# Patient Record
Sex: Male | Born: 1985 | Race: Black or African American | Hispanic: No | Marital: Single | Smoking: Current every day smoker
Health system: Southern US, Community
[De-identification: ages and names within clinical notes are randomized; demographics above are authoritative.]

## PROBLEM LIST (undated history)

## (undated) ENCOUNTER — Emergency Department (HOSPITAL_COMMUNITY): Admission: EM | Payer: No Typology Code available for payment source | Source: Home / Self Care

## (undated) VITALS — BP 144/84 | HR 60 | Temp 98.3°F | Resp 16 | Ht 71.0 in | Wt 129.0 lb

## (undated) DIAGNOSIS — Z905 Acquired absence of kidney: Secondary | ICD-10-CM

---

## 2007-04-10 ENCOUNTER — Emergency Department (HOSPITAL_COMMUNITY): Admission: EM | Admit: 2007-04-10 | Discharge: 2007-04-10 | Payer: Self-pay | Admitting: Family Medicine

## 2009-12-04 ENCOUNTER — Emergency Department (HOSPITAL_COMMUNITY): Admission: EM | Admit: 2009-12-04 | Discharge: 2009-12-04 | Payer: Self-pay | Admitting: Emergency Medicine

## 2012-08-19 ENCOUNTER — Emergency Department (INDEPENDENT_AMBULATORY_CARE_PROVIDER_SITE_OTHER)
Admission: EM | Admit: 2012-08-19 | Discharge: 2012-08-19 | Disposition: A | Discharge status: Home / Self Care | Payer: Self-pay | Source: Home / Self Care | Attending: Family Medicine | Admitting: Family Medicine

## 2012-08-19 ENCOUNTER — Encounter (HOSPITAL_COMMUNITY): Payer: Self-pay | Admitting: Emergency Medicine

## 2012-08-19 DIAGNOSIS — S39012A Strain of muscle, fascia and tendon of lower back, initial encounter: Secondary | ICD-10-CM

## 2012-08-19 DIAGNOSIS — J069 Acute upper respiratory infection, unspecified: Secondary | ICD-10-CM

## 2012-08-19 DIAGNOSIS — S335XXA Sprain of ligaments of lumbar spine, initial encounter: Secondary | ICD-10-CM

## 2012-08-19 MED ORDER — NAPROXEN 500 MG PO TABS: 500.0000 mg | ORAL_TABLET | Freq: Two times a day (BID) | ORAL | Status: DC

## 2012-08-19 MED ORDER — CYCLOBENZAPRINE HCL 10 MG PO TABS: 10.0000 mg | ORAL_TABLET | Freq: Three times a day (TID) | ORAL | Status: DC | PRN

## 2012-08-19 MED ORDER — CETIRIZINE-PSEUDOEPHEDRINE ER 5-120 MG PO TB12: 1.0000 | ORAL_TABLET | Freq: Two times a day (BID) | ORAL | Status: DC

## 2012-08-19 NOTE — ED Provider Notes (Signed)
History     CSN: [id number]  Arrival date & time 08/19/12  1208   First MD Initiated Contact with Patient 08/19/12 1312      Chief Complaint  Patient presents with  . URI    (Consider location/radiation/quality/duration/timing/severity/associated sxs/prior treatment) Patient is a 26 y.o. male presenting with URI and musculoskeletal pain. The history is provided by the patient.  URI The primary symptoms include cough. Primary symptoms do not include headaches or abdominal pain.  The cough began 3 to 5 days ago. The cough is non-productive.  The onset of the illness is associated with exposure to sick contacts. Symptoms associated with the illness include chills, sinus pressure, congestion and rhinorrhea. The following treatments were addressed: Acetaminophen was ineffective. A decongestant was effective (sudafed).  Muscle Pain This is a new problem. The current episode started more than 2 days ago. The problem occurs daily. The problem has not changed since onset.Pertinent negatives include no chest pain, no abdominal pain, no headaches and no shortness of breath. The symptoms are aggravated by bending. The symptoms are relieved by rest. He has tried nothing for the symptoms.    History reviewed. No pertinent past medical history.  History reviewed. No pertinent past surgical history.  History reviewed. No pertinent family history.  History  Substance Use Topics  . Smoking status: Never Smoker   . Smokeless tobacco: Not on file  . Alcohol Use: No      Review of Systems  Constitutional: Positive for chills.  HENT: Positive for congestion, rhinorrhea and sinus pressure.   Respiratory: Positive for cough. Negative for shortness of breath.   Cardiovascular: Negative for chest pain.  Gastrointestinal: Positive for diarrhea. Negative for abdominal pain.  Musculoskeletal: Positive for back pain.  Neurological: Negative for headaches.  All other systems reviewed and are  negative.    Allergies  Review of patient's allergies indicates no known allergies.  Home Medications   Current Outpatient Rx  Name  Route  Sig  Dispense  Refill  . CETIRIZINE-PSEUDOEPHEDRINE ER 5-120 MG PO TB12   Oral   Take 1 tablet by mouth 2 (two) times daily.   30 tablet   1   . CYCLOBENZAPRINE HCL 10 MG PO TABS   Oral   Take 1 tablet (10 mg total) by mouth 3 (three) times daily as needed for muscle spasms.   30 tablet   0   . NAPROXEN 500 MG PO TABS   Oral   Take 1 tablet (500 mg total) by mouth 2 (two) times daily.   60 tablet   0     BP 132/73  Pulse 79  Temp 99.9 F (37.7 C) (Oral)  Resp 19  SpO2 100%  Physical Exam  Nursing note and vitals reviewed. Constitutional: He is oriented to person, place, and time. Vital signs are normal. He appears well-developed and well-nourished. He is active and cooperative.  HENT:  Head: Normocephalic.  Right Ear: External ear normal.  Left Ear: External ear normal.  Nose: Nose normal.  Mouth/Throat: Oropharynx is clear and moist. No oropharyngeal exudate.  Eyes: Conjunctivae normal and EOM are normal. Pupils are equal, round, and reactive to light. No scleral icterus.  Neck: Trachea normal and normal range of motion. Neck supple.  Cardiovascular: Normal rate, regular rhythm, normal heart sounds and intact distal pulses.   No murmur heard. Pulmonary/Chest: Effort normal and breath sounds normal.  Musculoskeletal:       Cervical back: Normal.  Thoracic back: Normal.       Lumbar back: Normal.  Lymphadenopathy:       Head (right side): No submental, no submandibular, no tonsillar, no preauricular, no posterior auricular and no occipital adenopathy present.       Head (left side): No submental, no submandibular, no tonsillar, no preauricular, no posterior auricular and no occipital adenopathy present.    He has no cervical adenopathy.  Neurological: He is alert and oriented to person, place, and time. No cranial  nerve deficit or sensory deficit.  Skin: Skin is warm and dry. No rash noted.  Psychiatric: He has a normal mood and affect. His speech is normal and behavior is normal. Judgment and thought content normal. Cognition and memory are normal.    ED Course  Procedures (including critical care time)  Labs Reviewed - No data to display No results found.   1. URI (upper respiratory infection)   2. Lumbar strain       MDM  Increase fluid intake, rest.  Begin expectorant/decongestant, topical decongestant, saline nasal spray and/or saline irrigation, and cough suppressant at bedtime. Antihistamines of your choice (Claritin or Zyrtec).  Tylenol or Motrin for fever/discomfort.  Followup with PCP if not improving 7 to 10 days.  Heat therapy, nsaids and relaxants for back pain.      Johnsie Kindred, NP 08/19/12 1320

## 2012-08-19 NOTE — ED Notes (Signed)
Reports cough, chills, and diarrhea since Sunday.  Denies vomiting and taking any medication.

## 2012-08-21 NOTE — ED Provider Notes (Signed)
Medical screening examination/treatment/procedure(s) were performed by non-physician practitioner and as supervising physician I was immediately available for consultation/collaboration.   [facility]; MD   Sharin Grave, MD 08/21/12 [phone]

## 2015-03-16 ENCOUNTER — Encounter (HOSPITAL_COMMUNITY): Payer: Self-pay | Admitting: Emergency Medicine

## 2015-03-16 ENCOUNTER — Emergency Department (HOSPITAL_COMMUNITY)
Admission: EM | Admit: 2015-03-16 | Discharge: 2015-03-16 | Disposition: A | Discharge status: Home / Self Care | Payer: Self-pay | Attending: Emergency Medicine | Admitting: Emergency Medicine

## 2015-03-16 DIAGNOSIS — Z79899 Other long term (current) drug therapy: Secondary | ICD-10-CM | POA: Insufficient documentation

## 2015-03-16 DIAGNOSIS — K0889 Other specified disorders of teeth and supporting structures: Secondary | ICD-10-CM

## 2015-03-16 DIAGNOSIS — Z791 Long term (current) use of non-steroidal anti-inflammatories (NSAID): Secondary | ICD-10-CM | POA: Insufficient documentation

## 2015-03-16 DIAGNOSIS — R519 Headache, unspecified: Secondary | ICD-10-CM

## 2015-03-16 DIAGNOSIS — D170 Benign lipomatous neoplasm of skin and subcutaneous tissue of head, face and neck: Secondary | ICD-10-CM | POA: Insufficient documentation

## 2015-03-16 DIAGNOSIS — K088 Other specified disorders of teeth and supporting structures: Secondary | ICD-10-CM | POA: Insufficient documentation

## 2015-03-16 DIAGNOSIS — D179 Benign lipomatous neoplasm, unspecified: Secondary | ICD-10-CM

## 2015-03-16 DIAGNOSIS — R51 Headache: Secondary | ICD-10-CM | POA: Insufficient documentation

## 2015-03-16 DIAGNOSIS — Z720 Tobacco use: Secondary | ICD-10-CM | POA: Insufficient documentation

## 2015-03-16 MED ORDER — PENICILLIN V POTASSIUM 250 MG PO TABS: 250.0000 mg | ORAL_TABLET | Freq: Four times a day (QID) | ORAL | Status: AC

## 2015-03-16 MED ORDER — CYCLOBENZAPRINE HCL 10 MG PO TABS: 10.0000 mg | ORAL_TABLET | Freq: Two times a day (BID) | ORAL | Status: DC | PRN

## 2015-03-16 NOTE — ED Provider Notes (Signed)
CSN: [id number]     Arrival date & time 03/16/15  0945 History   First MD Initiated Contact with Patient 03/16/15 [phone]     Chief Complaint  Patient presents with  . knot of head   . Neck Pain     (Consider location/radiation/quality/duration/timing/severity/associated sxs/prior Treatment) HPI Comments: Patient presents to the ED with a chief complaint of headache, knot on forehead, and dental pain.  Patient states that he has had a knot on his head for the past 9 months.  The knot is non-tender to palpation.  Denies any discharge or drainage.  Denies any fevers or chills.  Headache: intermittent headaches with associated upper back tightness/tenderness.  Has not tried taking anything to alleviate his symptoms.  Symptoms are improved with massage.    The history is provided by the patient. No language interpreter was used.    History reviewed. No pertinent past medical history. History reviewed. No pertinent past surgical history. Family History  Problem Relation Age of Onset  . Alcoholism Mother   . Cirrhosis Mother   . Cancer Sister   . Cirrhosis Brother   . Lupus Other    History  Substance Use Topics  . Smoking status: Current Every Day Smoker  . Smokeless tobacco: Not on file  . Alcohol Use: No     Comment: socially    Review of Systems  Constitutional: Negative for fever and chills.  HENT: Positive for dental problem.   Respiratory: Negative for shortness of breath.   Cardiovascular: Negative for chest pain.  Gastrointestinal: Negative for nausea, vomiting, diarrhea and constipation.  Genitourinary: Negative for dysuria.  Musculoskeletal: Positive for myalgias and back pain.  Neurological: Positive for headaches.  All other systems reviewed and are negative.     Allergies  Review of patient's allergies indicates no known allergies.  Home Medications   Prior to Admission medications   Medication Sig Start Date End Date Taking? Authorizing Provider   cetirizine-pseudoephedrine (ZYRTEC-D) 5-120 MG per tablet Take 1 tablet by mouth 2 (two) times daily. 08/19/12   Awilda Metro, NP  cyclobenzaprine (FLEXERIL) 10 MG tablet Take 1 tablet (10 mg total) by mouth 3 (three) times daily as needed for muscle spasms. 08/19/12   Awilda Metro, NP  naproxen (NAPROSYN) 500 MG tablet Take 1 tablet (500 mg total) by mouth 2 (two) times daily. 08/19/12   Carmen L Chatten, NP   BP 141/76 mmHg  Pulse 61  Temp(Src) 97.6 F (36.4 C) (Oral)  Resp 18  SpO2 100% Physical Exam  Constitutional: He is oriented to person, place, and time. He appears well-developed and well-nourished.  HENT:  Head: Normocephalic and atraumatic.  Right Ear: External ear normal.  Left Ear: External ear normal.  2x2 cm lipoma in center of forehead  Eyes: Conjunctivae and EOM are normal. Pupils are equal, round, and reactive to light. Right eye exhibits no discharge. Left eye exhibits no discharge. No scleral icterus.  Neck: Normal range of motion. Neck supple. No JVD present.  No pain with neck flexion, no meningismus  Cardiovascular: Normal rate, regular rhythm and normal heart sounds.  Exam reveals no gallop and no friction rub.   No murmur heard. Pulmonary/Chest: Effort normal and breath sounds normal. No respiratory distress. He has no wheezes. He has no rales. He exhibits no tenderness.  Abdominal: Soft. He exhibits no distension and no mass. There is no tenderness. There is no rebound and no guarding.  Musculoskeletal: Normal range of motion. He exhibits no  edema or tenderness.  Normal gait.  Neurological: He is alert and oriented to person, place, and time. He has normal reflexes.  CN 3-12 intact, speech is clear, movements goal oriented, sensation and strength intact bilaterally.  Skin: Skin is warm and dry.  Psychiatric: He has a normal mood and affect. His behavior is normal. Judgment and thought content normal.  Nursing note and vitals reviewed.   ED Course   Procedures (including critical care time) Labs Review Labs Reviewed - No data to display  Imaging Review No results found.   EKG Interpretation None      MDM   Final diagnoses:  Headache, unspecified headache type  Lipoma  Pain, dental    Patient with lipoma of forehead, tension headache, and dental pain.  DC with conservative therapy and plastic surgery/dental follow-up.  Seen by and discussed with Dr. Tawnya Crook, who agrees with the plan.    Montine Circle, PA-C 03/16/15 1038  Ernestina Patches, MD 03/17/15 [phone]

## 2015-03-16 NOTE — ED Notes (Signed)
Pt has knot-- in center of forehead that has been there for 9 months. States is causing headaches -- has had increased stress level due to sister passed in March of Leukemia and brother passed in October-- of cirrhosis. Raising sister's children and own children. States has stiff neck , pain in neck also

## 2015-03-16 NOTE — Discharge Instructions (Signed)
Dental Pain A tooth ache may be caused by cavities (tooth decay). Cavities expose the nerve of the tooth to air and hot or cold temperatures. It may come from an infection or abscess (also called a boil or furuncle) around your tooth. It is also often caused by dental caries (tooth decay). This causes the pain you are having. DIAGNOSIS  Your caregiver can diagnose this problem by exam. TREATMENT   If caused by an infection, it may be treated with medications which kill germs (antibiotics) and pain medications as prescribed by your caregiver. Take medications as directed.  Only take over-the-counter or prescription medicines for pain, discomfort, or fever as directed by your caregiver.  Whether the tooth ache today is caused by infection or dental disease, you should see your dentist as soon as possible for further care. SEEK MEDICAL CARE IF: The exam and treatment you received today has been provided on an emergency basis only. This is not a substitute for complete medical or dental care. If your problem worsens or new problems (symptoms) appear, and you are unable to meet with your dentist, call or return to this location. SEEK IMMEDIATE MEDICAL CARE IF:   You have a fever.  You develop redness and swelling of your face, jaw, or neck.  You are unable to open your mouth.  You have severe pain uncontrolled by pain medicine. MAKE SURE YOU:   Understand these instructions.  Will watch your condition.  Will get help right away if you are not doing well or get worse. Document Released: 09/02/2005 Document Revised: 11/25/2011 Document Reviewed: 04/20/2008 [facility] Patient Information 2015 [place], [place]. This information is not intended to replace advice given to you by your health care provider. Make sure you discuss any questions you have with your health care provider. Lipoma A lipoma is a noncancerous (benign) tumor composed of fat cells. They are usually found under the skin  (subcutaneous). A lipoma may occur in any tissue of the body that contains fat. Common areas for lipomas to appear include the back, shoulders, buttocks, and thighs. Lipomas are a very common soft tissue growth. They are soft and grow slowly. Most problems caused by a lipoma depend on where it is growing. DIAGNOSIS  A lipoma can be diagnosed with a physical exam. These tumors rarely become cancerous, but radiographic studies can help determine this for certain. Studies used may include:  Computerized X-ray scans (CT or CAT scan).  Computerized magnetic scans (MRI). TREATMENT  Small lipomas that are not causing problems may be watched. If a lipoma continues to enlarge or causes problems, removal is often the best treatment. Lipomas can also be removed to improve appearance. Surgery is done to remove the fatty cells and the surrounding capsule. Most often, this is done with medicine that numbs the area (local anesthetic). The removed tissue is examined under a microscope to make sure it is not cancerous. Keep all follow-up appointments with your caregiver. SEEK MEDICAL CARE IF:   The lipoma becomes larger or hard.  The lipoma becomes painful, red, or increasingly swollen. These could be signs of infection or a more serious condition. Document Released: 08/23/2002 Document Revised: 11/25/2011 Document Reviewed: 02/02/2010 [facility] Patient Information 2015 [place], [place]. This information is not intended to replace advice given to you by your health care provider. Make sure you discuss any questions you have with your health care provider. Tension Headache A tension headache is a feeling of pain, pressure, or aching often felt over the front and  sides of the head. The pain can be dull or can feel tight (constricting). It is the most common type of headache. Tension headaches are not normally associated with nausea or vomiting and do not get worse with physical activity. Tension headaches can last 30  minutes to several days.  CAUSES  The exact cause is not known, but it may be caused by chemicals and hormones in the brain that lead to pain. Tension headaches often begin after stress, anxiety, or depression. Other triggers may include:  Alcohol.  Caffeine (too much or withdrawal).  Respiratory infections (colds, flu, sinus infections).  Dental problems or teeth clenching.  Fatigue.  Holding your head and neck in one position too long while using a computer. SYMPTOMS   Pressure around the head.   Dull, aching head pain.   Pain felt over the front and sides of the head.   Tenderness in the muscles of the head, neck, and shoulders. DIAGNOSIS  A tension headache is often diagnosed based on:   Symptoms.   Physical examination.   A CT scan or MRI of your head. These tests may be ordered if symptoms are severe or unusual. TREATMENT  Medicines may be given to help relieve symptoms.  HOME CARE INSTRUCTIONS   Only take over-the-counter or prescription medicines for pain or discomfort as directed by your caregiver.   Lie down in a dark, quiet room when you have a headache.   Keep a journal to find out what may be triggering your headaches. For example, write down:  What you eat and drink.  How much sleep you get.  Any change to your diet or medicines.  Try massage or other relaxation techniques.   Ice packs or heat applied to the head and neck can be used. Use these 3 to 4 times per day for 15 to 20 minutes each time, or as needed.   Limit stress.   Sit up straight, and do not tense your muscles.   Quit smoking if you smoke.  Limit alcohol use.  Decrease the amount of caffeine you drink, or stop drinking caffeine.  Eat and exercise regularly.  Get 7 to 9 hours of sleep, or as recommended by your caregiver.  Avoid excessive use of pain medicine as recurrent headaches can occur.  SEEK MEDICAL CARE IF:   You have problems with the medicines you  were prescribed.  Your medicines do not work.  You have a change from the usual headache.  You have nausea or vomiting. SEEK IMMEDIATE MEDICAL CARE IF:   Your headache becomes severe.  You have a fever.  You have a stiff neck.  You have loss of vision.  You have muscular weakness or loss of muscle control.  You lose your balance or have trouble walking.  You feel faint or pass out.  You have severe symptoms that are different from your first symptoms. MAKE SURE YOU:   Understand these instructions.  Will watch your condition.  Will get help right away if you are not doing well or get worse. Document Released: 09/02/2005 Document Revised: 11/25/2011 Document Reviewed: 08/23/2011 [facility] Patient Information 2015 [place], [place]. This information is not intended to replace advice given to you by your health care provider. Make sure you discuss any questions you have with your health care provider.

## 2016-03-02 ENCOUNTER — Encounter (HOSPITAL_COMMUNITY): Payer: Self-pay

## 2016-03-02 ENCOUNTER — Emergency Department (HOSPITAL_COMMUNITY): Payer: Self-pay

## 2016-03-02 ENCOUNTER — Inpatient Hospital Stay (HOSPITAL_COMMUNITY)
Admission: EM | Admit: 2016-03-02 | Discharge: 2016-03-05 | DRG: 201 | Disposition: A | Discharge status: Home / Self Care | Payer: Self-pay | Attending: Surgery | Admitting: Surgery

## 2016-03-02 DIAGNOSIS — J939 Pneumothorax, unspecified: Secondary | ICD-10-CM | POA: Diagnosis present

## 2016-03-02 DIAGNOSIS — Z4682 Encounter for fitting and adjustment of non-vascular catheter: Secondary | ICD-10-CM

## 2016-03-02 DIAGNOSIS — J9311 Primary spontaneous pneumothorax: Secondary | ICD-10-CM

## 2016-03-02 DIAGNOSIS — F17210 Nicotine dependence, cigarettes, uncomplicated: Secondary | ICD-10-CM | POA: Diagnosis present

## 2016-03-02 DIAGNOSIS — J9383 Other pneumothorax: Principal | ICD-10-CM | POA: Diagnosis present

## 2016-03-02 LAB — CBC
HCT: 42.2 % (ref 39.0–52.0)
HEMOGLOBIN: 14.4 g/dL (ref 13.0–17.0)
MCH: 31.4 pg (ref 26.0–34.0)
MCHC: 34.1 g/dL (ref 30.0–36.0)
MCV: 92.1 fL (ref 78.0–100.0)
Platelets: 215 10*3/uL (ref 150–400)
RBC: 4.58 MIL/uL (ref 4.22–5.81)
RDW: 12.3 % (ref 11.5–15.5)
WBC: 6.4 10*3/uL (ref 4.0–10.5)

## 2016-03-02 LAB — BASIC METABOLIC PANEL
ANION GAP: 8 (ref 5–15)
BUN: 10 mg/dL (ref 6–20)
CHLORIDE: 103 mmol/L (ref 101–111)
CO2: 25 mmol/L (ref 22–32)
Calcium: 10 mg/dL (ref 8.9–10.3)
Creatinine, Ser: 1.11 mg/dL (ref 0.61–1.24)
Glucose, Bld: 107 mg/dL — ABNORMAL HIGH (ref 65–99)
POTASSIUM: 3.9 mmol/L (ref 3.5–5.1)
SODIUM: 136 mmol/L (ref 135–145)

## 2016-03-02 LAB — I-STAT TROPONIN, ED: TROPONIN I, POC: 0 ng/mL (ref 0.00–0.08)

## 2016-03-02 MED ORDER — ENOXAPARIN SODIUM 40 MG/0.4ML ~~LOC~~ SOLN
40.0000 mg | SUBCUTANEOUS | Status: DC
  Administered 2016-03-02 – 2016-03-04 (×3): 40 mg via SUBCUTANEOUS
  Filled 2016-03-02 (×3): qty 0.4

## 2016-03-02 MED ORDER — MORPHINE SULFATE (PF) 4 MG/ML IV SOLN
4.0000 mg | Freq: Once | INTRAVENOUS | Status: AC
  Administered 2016-03-02: 4 mg via INTRAVENOUS
  Filled 2016-03-02: qty 1

## 2016-03-02 MED ORDER — SODIUM CHLORIDE 0.9% FLUSH: 3.0000 mL | INTRAVENOUS | Status: DC | PRN

## 2016-03-02 MED ORDER — ACETAMINOPHEN 325 MG PO TABS
650.0000 mg | ORAL_TABLET | Freq: Four times a day (QID) | ORAL | Status: DC | PRN
  Administered 2016-03-04: 650 mg via ORAL
  Filled 2016-03-02 (×2): qty 2

## 2016-03-02 MED ORDER — SODIUM CHLORIDE 0.9% FLUSH
3.0000 mL | Freq: Two times a day (BID) | INTRAVENOUS | Status: DC
Start: 2016-03-02 — End: 2016-03-05
  Administered 2016-03-03 (×2): 3 mL via INTRAVENOUS

## 2016-03-02 MED ORDER — SENNA 8.6 MG PO TABS
1.0000 | ORAL_TABLET | Freq: Two times a day (BID) | ORAL | Status: DC
  Administered 2016-03-03 – 2016-03-04 (×2): 8.6 mg via ORAL
  Filled 2016-03-02 (×4): qty 1

## 2016-03-02 MED ORDER — SODIUM CHLORIDE 0.9% FLUSH
3.0000 mL | Freq: Two times a day (BID) | INTRAVENOUS | Status: DC
Start: 2016-03-02 — End: 2016-03-05
  Administered 2016-03-02 – 2016-03-05 (×5): 3 mL via INTRAVENOUS

## 2016-03-02 MED ORDER — MIDAZOLAM HCL 2 MG/2ML IJ SOLN
2.0000 mg | Freq: Once | INTRAMUSCULAR | Status: AC
  Administered 2016-03-02: 2 mg via INTRAVENOUS

## 2016-03-02 MED ORDER — MORPHINE SULFATE (PF) 4 MG/ML IV SOLN
4.0000 mg | INTRAVENOUS | Status: DC | PRN
  Administered 2016-03-02: 4 mg via INTRAVENOUS
  Filled 2016-03-02: qty 1

## 2016-03-02 MED ORDER — SODIUM CHLORIDE 0.9 % IV SOLN: 250.0000 mL | INTRAVENOUS | Status: DC | PRN

## 2016-03-02 MED ORDER — OXYCODONE HCL 5 MG PO TABS
5.0000 mg | ORAL_TABLET | ORAL | Status: DC | PRN
  Administered 2016-03-02: 10 mg via ORAL
  Administered 2016-03-03: 5 mg via ORAL
  Administered 2016-03-03 – 2016-03-05 (×8): 10 mg via ORAL
  Filled 2016-03-02 (×11): qty 2

## 2016-03-02 MED ORDER — ONDANSETRON HCL 4 MG/2ML IJ SOLN
4.0000 mg | Freq: Once | INTRAMUSCULAR | Status: AC
  Administered 2016-03-02: 4 mg via INTRAVENOUS
  Filled 2016-03-02: qty 2

## 2016-03-02 MED ORDER — MIDAZOLAM HCL 2 MG/2ML IJ SOLN
INTRAMUSCULAR | Status: AC
  Filled 2016-03-02: qty 2

## 2016-03-02 MED ORDER — OXYCODONE HCL 5 MG PO TABS
5.0000 mg | ORAL_TABLET | ORAL | Status: DC | PRN
  Administered 2016-03-02: 5 mg via ORAL
  Filled 2016-03-02: qty 1

## 2016-03-02 MED ORDER — OXYCODONE HCL 5 MG PO TABS
10.0000 mg | ORAL_TABLET | ORAL | Status: DC | PRN
  Filled 2016-03-02: qty 2

## 2016-03-02 NOTE — ED Notes (Addendum)
Patient transported to X-ray 
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
## 2016-03-02 NOTE — ED Notes (Signed)
X-ray at bedside

## 2016-03-02 NOTE — CV Procedure (Signed)
Chest Tube Insertion Procedure Note  Indications:  Clinically significant left spontaneousPneumothorax  Pre-operative Diagnosis: Pneumothorax  Post-operative Diagnosis: Pneumothorax  Procedure Details  Informed consent was obtained for the procedure, including sedation.  Risks of lung perforation, hemorrhage, arrhythmia, and adverse drug reaction were discussed.   After sterile skin prep, using standard technique, a 20 French tube was placed in the left lateral 6th rib space.  Findings: None  Estimated Blood Loss:  Minimal         Specimens:  None              Complications:  None; patient tolerated the procedure well.         Disposition: admit to 2W         Condition: stable  Attending Attestation: I performed the procedure.

## 2016-03-02 NOTE — ED Notes (Signed)
Patient complains of 3 days of chest pain that he describes as sharp with shortness of breath with exertion. Denies trauma

## 2016-03-02 NOTE — ED Notes (Signed)
Dr. Cyndia Bent at bedside preparing for chest tube insertion
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
## 2016-03-03 ENCOUNTER — Inpatient Hospital Stay (HOSPITAL_COMMUNITY): Payer: Self-pay

## 2016-03-03 MED ORDER — IBUPROFEN 800 MG PO TABS
800.0000 mg | ORAL_TABLET | Freq: Three times a day (TID) | ORAL | Status: DC | PRN
  Administered 2016-03-03 – 2016-03-05 (×5): 800 mg via ORAL
  Filled 2016-03-03 (×5): qty 1

## 2016-03-03 NOTE — Progress Notes (Signed)
Utilization review completed.  

## 2016-03-03 NOTE — Progress Notes (Addendum)
      [address]       [place] [address]             [phone]      Subjective:  Some mild pain at chest tube site  Objective: Vital signs in last 24 hours: Temp:  [98.1 F (36.7 C)-98.9 F (37.2 C)] 98.3 F (36.8 C) (06/18 0541) Pulse Rate:  [50-87] 61 (06/18 0541) Cardiac Rhythm:  [-] Sinus bradycardia (06/17 1923) Resp:  [13-20] 16 (06/18 0541) BP: (119-157)/(75-97) 134/87 mmHg (06/18 0541) SpO2:  [95 %-100 %] 100 % (06/18 0541) Weight:  [137 lb (62.143 kg)] 137 lb (62.143 kg) (06/17 0951)  General appearance: alert, cooperative and no distress Heart: regular rate and rhythm Lungs: clear to auscultation bilaterally Abdomen: soft, non-tender; bowel sounds normal; no masses,  no organomegaly Wound: clean and dry  Lab Results:  Recent Labs  03/02/16 1005  WBC 6.4  HGB 14.4  HCT 42.2  PLT 215   BMET:  Recent Labs  03/02/16 1005  NA 136  K 3.9  CL 103  CO2 25  GLUCOSE 107*  BUN 10  CREATININE 1.11  CALCIUM 10.0    PT/INR: No results for input(s): LABPROT, INR in the last 72 hours. ABG No results found for: PHART, HCO3, TCO2, ACIDBASEDEF, O2SAT CBG (last 3)  No results for input(s): GLUCAP in the last 72 hours.  Assessment/Plan:  1. Spontaneous Pneumothorax- S/P Chest tube placement- no air leak appreciated, CXR with minimal apical pneumothorax... Likely leave on suction today 2. Pain control- utilizing OXY appropriately, doesn't usually last the whole time... Will add Ibuprofen for additional relief, creatinine was elevated so want to avoid Toradol 3. Dispo- patient stable, likely leave chest tube to suction today, repeat CXR in AM   LOS: 1 day    BARRETT, ERIN 03/03/2016   Chart reviewed, patient examined, agree with above. CXR ok and there is no air leak from chest tube. Will put to water seal in the am and plan to remove Tuesday.

## 2016-03-04 ENCOUNTER — Inpatient Hospital Stay (HOSPITAL_COMMUNITY): Payer: Self-pay

## 2016-03-04 NOTE — Progress Notes (Addendum)
      [address]       [place] [address]             [phone]      Subjective:  Xavier Howell is doing better.   He states Ibuprofen helped cover his pain a little better.  Objective: Vital signs in last 24 hours: Temp:  [98.2 F (36.8 C)-98.3 F (36.8 C)] 98.2 F (36.8 C) (06/19 0554) Pulse Rate:  [50] 50 (06/19 0554) Cardiac Rhythm:  [-] Sinus bradycardia (06/19 0700) Resp:  [16-18] 16 (06/19 0554) BP: (132-150)/(82-91) 132/82 mmHg (06/19 0554) SpO2:  [100 %] 100 % (06/19 0554)  Intake/Output from previous day: 06/18 0701 - 06/19 0700 In: 240 [P.O.:240] Out: -   General appearance: alert, cooperative and no distress Heart: regular rate and rhythm Lungs: clear to auscultation bilaterally Abdomen: soft, non-tender; bowel sounds normal; no masses,  no organomegaly Extremities: extremities normal, atraumatic, no cyanosis or edema Wound: clean and dry  Lab Results:  Recent Labs  03/02/16 1005  WBC 6.4  HGB 14.4  HCT 42.2  PLT 215   BMET:  Recent Labs  03/02/16 1005  NA 136  K 3.9  CL 103  CO2 25  GLUCOSE 107*  BUN 10  CREATININE 1.11  CALCIUM 10.0    PT/INR: No results for input(s): LABPROT, INR in the last 72 hours. ABG No results found for: PHART, HCO3, TCO2, ACIDBASEDEF, O2SAT CBG (last 3)  No results for input(s): GLUCAP in the last 72 hours.  Assessment/Plan:  1. Chest tube- no air leak, CXR with stable appearance of tiny pneumothorax 2. Pain control- better continue Oxy, Ibuprofen 3. Dispo- patient stable, chest tube to water seal.... Repeat CXR in AM   LOS: 2 days    [name] 03/04/2016   Chart reviewed, patient examined, agree with above. No air leak Chest tube to water seal. Will remove it in the am and repeat CXR. He can go home tomorrow afternoon.

## 2016-03-04 NOTE — Discharge Instructions (Signed)
Pneumothorax °A pneumothorax, commonly called a collapsed lung, is a condition in which air leaks from a lung and builds up in the space between the lung and the chest wall (pleural space). The air in a pneumothorax is trapped outside the lung and takes up space, preventing the lung from fully expanding. This is a condition that usually occurs suddenly. The buildup of air may be small or large. A small pneumothorax may go away on its own. When a pneumothorax is larger, it will often require medical treatment and hospitalization.  °CAUSES  °A pneumothorax can sometimes happen quickly with no apparent cause. People with underlying lung problems, particularly COPD or emphysema, are at higher risk of pneumothorax. However, pneumothorax can happen quickly even in people with no prior known lung problems. Trauma, surgery, medical procedures, or injury to the chest wall can also cause a pneumothorax. °SIGNS AND SYMPTOMS  °Sometimes a pneumothorax will have no symptoms. When symptoms are present, they can include: °· Chest pain. °· Shortness of breath. °· Increased rate of breathing. °· Bluish color to your lips or skin (cyanosis). °DIAGNOSIS  °Pneumothorax is usually diagnosed by a chest X-ray or chest CT scan. Your health care provider will also take a medical history and perform a physical exam to determine why you may have a pneumothorax. °TREATMENT  °A small pneumothorax may go away on its own without treatment. Extra oxygen can sometimes help a small pneumothorax go away more quickly. For a larger pneumothorax or a pneumothorax that is causing symptoms, a procedure is usually needed to drain the air. In some cases, the health care provider may drain the air using a needle. In other cases, a chest tube may be inserted into the pleural space. A chest tube is a small tube placed between the ribs and into the pleural space. This removes the extra air and allows the lung to expand back to its normal size. A large  pneumothorax will usually require a hospital stay. If there is ongoing air leakage into the pleural space, then the chest tube may need to remain in place for several days until the air leak has healed. In some cases, surgery may be needed.  °HOME CARE INSTRUCTIONS  °· Only take over-the-counter or prescription medicines as directed by your health care provider. °· If a cough or pain makes it difficult for you to sleep at night, try sleeping in a semi-upright position in a recliner or by using 2 or 3 pillows. °· Rest and limit activity as directed by your health care provider. °· If you had a chest tube and it was removed, ask your health care provider when it is okay to remove the dressing. Until your health care provider says you can remove the dressing, do not allow it to get wet. °· Do not smoke. Smoking is a risk factor for pneumothorax. °· Do not fly in an airplane or scuba dive until your health care provider says it is okay. °· Follow up with your health care provider as directed. °SEEK IMMEDIATE MEDICAL CARE IF:  °· You have increasing chest pain or shortness of breath. °· You have a cough that is not controlled with suppressants. °· You begin coughing up blood. °· You have pain that is getting worse or is not controlled with medicines. °· You cough up thick, discolored mucus (sputum) that is yellow to green in color. °· You have redness, increasing pain, or discharge at the site where a chest tube had been in place (if   your pneumothorax was treated with a chest tube). °· The site where your chest tube was located opens up. °· You feel air coming out of the site where the chest tube was placed. °· You have a fever or persistent symptoms for more than 2-3 days. °· You have a fever and your symptoms suddenly get worse. °MAKE SURE YOU:  °· Understand these instructions. °· Will watch your condition. °· Will get help right away if you are not doing well or get worse. °  °This information is not intended to  replace advice given to you by your health care provider. Make sure you discuss any questions you have with your health care provider. °  °Document Released: 09/02/2005 Document Revised: 06/23/2013 Document Reviewed: 04/01/2013 °[employer] Interactive Patient Education ©2016 [employer] ° °

## 2016-03-04 NOTE — Discharge Summary (Signed)
Physician Discharge Summary  Patient ID: Xavier Howell MRN: [id number] DOB/AGE: Nov 11, 1985 30 y.o.  Admit date: 03/02/2016 Discharge date: 03/05/2016  Admission Diagnoses:  Patient Active Problem List   Diagnosis Date Noted  . Pneumothorax on left 03/02/2016   Discharge Diagnoses:   Patient Active Problem List   Diagnosis Date Noted  . Pneumothorax on left 03/02/2016   Discharged Condition: good  History of Present Illness:  Mr. Prante is a 30 yo [place] American male with known history of nicotine abuse.  He presented to the ED on 03/02/2016 with a 3 day complaint of chest pain.  He initially noticed this after eating a spicy hamburger and felt it was indigestion.  However his symptoms progressed and he developed shortness of breath on 03/01/2016 which became severe.  CXR obtained in the ED showed a large left sided pneumothorax.  It was felt chest tube placement would be indicated and TCTS consult was obtained.  He was evaluated by Dr. Cyndia Bent who was in agreement the patient would require admission for chest tube placement.  Hospital Course:   In the ED the patient underwent placement of a 20 Fr chest tube under sterile technique.  The patient tolerated the procedure without difficulty.  Follow up with CXR showed resolution of the pneumothorax.  The patient has done well during his hospitalization.  His CXR has been stable with appearance of a tiny apical pneumothorax.  His chest tube has not exhibited an air leak and was transitioned to water seal on HD #2.  Follow up CXR showed minimal apical pneumothorax.  Being his chest tube did not exhibit an air leak it was able to be removed on POD #3.  Another CXR was obtained and showed no evidence of pneumothorax.  The patient is otherwise medically stable.  His pain is well controlled.  He is ambulating independently.  He is tolerating a diet.  He is felt medically stable for discharge home today.  He was educated to return to the ED or  contact our office should symptoms similar to his presenting symptoms develop.  Treatments: Chest Tube Placement  Disposition: 01-Home or Self Care   Discharge Medications:     Medication List    TAKE these medications        acetaminophen 325 MG tablet  Commonly known as:  TYLENOL  Take 2 tablets (650 mg total) by mouth every 6 (six) hours as needed for moderate pain.     ibuprofen 800 MG tablet  Commonly known as:  ADVIL,MOTRIN  Take 1 tablet (800 mg total) by mouth every 8 (eight) hours as needed for moderate pain.     oxyCODONE 5 MG immediate release tablet  Commonly known as:  Oxy IR/ROXICODONE  Take 1-2 tablets (5-10 mg total) by mouth every 4 (four) hours as needed for moderate pain.       Follow-up Information    Follow up with Gaye Pollack, MD On 03/20/2016.   Specialty:  Cardiothoracic Surgery   Why:  Appointment is at 4:00   Contact information:   [address] [place] [address] [phone]       Follow up with [facility].   Why:  Please get CXR 30 min prior to your appointment with Dr. Cyndia Bent, located on first floor of our office building   Contact information:   [place]       Signed: [name] 03/05/2016, 2:19 PM

## 2016-03-05 ENCOUNTER — Inpatient Hospital Stay (HOSPITAL_COMMUNITY): Payer: Self-pay

## 2016-03-05 ENCOUNTER — Inpatient Hospital Stay (HOSPITAL_COMMUNITY): Payer: MEDICAID

## 2016-03-05 MED ORDER — OXYCODONE HCL 5 MG PO TABS: 5.0000 mg | ORAL_TABLET | ORAL | Status: DC | PRN

## 2016-03-05 MED ORDER — IBUPROFEN 800 MG PO TABS: 800.0000 mg | ORAL_TABLET | Freq: Three times a day (TID) | ORAL | Status: DC | PRN

## 2016-03-05 MED ORDER — ACETAMINOPHEN 325 MG PO TABS: 650.0000 mg | ORAL_TABLET | Freq: Four times a day (QID) | ORAL | Status: DC | PRN

## 2016-03-05 NOTE — Care Management Note (Signed)
Case Management Note Marvetta Gibbons RN, BSN Unit 2W-Case Manager [phone]  Patient Details  Name: Xavier Howell MRN: [id number] Date of Birth: 12/29/1985  Subjective/Objective:  Pt admitted with spontaneous pntx - chest tube placed                Action/Plan: PTA pt lived at home- independent- anticipate return home- no needs  Expected Discharge Date: 03/05/16               Expected Discharge Plan:  Home/Self Care  In-House Referral:     Discharge planning Services  CM Consult  Post Acute Care Choice:    Choice offered to:     DME Arranged:    DME Agency:     HH Arranged:    [place] Agency:     Status of Service:  Completed, signed off  If discussed at [employer] of Stay Meetings, dates discussed:    Additional Comments:  Dawayne Patricia, RN 03/05/2016, 2:26 PM

## 2016-03-05 NOTE — Progress Notes (Addendum)
      [address]       [place] [address]             [phone]      Subjective:  [name] has no mild chest discomfort today.  He is hopeful to go home today.  Objective: Vital signs in last 24 hours: Temp:  [97.6 F (36.4 C)-98.2 F (36.8 C)] 97.6 F (36.4 C) (06/20 0702) Pulse Rate:  [54-57] 57 (06/20 0702) Cardiac Rhythm:  [-] Sinus bradycardia (06/19 1945) Resp:  [18-20] 18 (06/20 0702) BP: (118-135)/(59-86) 124/59 mmHg (06/20 0702) SpO2:  [96 %-100 %] 96 % (06/20 0702)  Intake/Output from previous day: 06/19 0701 - 06/20 0700 In: 365 [P.O.:362; I.V.:3] Out: -   General appearance: alert, cooperative and no distress Heart: regular rate and rhythm Lungs: clear to auscultation bilaterally Abdomen: soft, non-tender; bowel sounds normal; no masses,  no organomegaly Extremities: extremities normal, atraumatic, no cyanosis or edema Wound: clean and dry  Lab Results:  Recent Labs  03/02/16 1005  WBC 6.4  HGB 14.4  HCT 42.2  PLT 215   BMET:  Recent Labs  03/02/16 1005  NA 136  K 3.9  CL 103  CO2 25  GLUCOSE 107*  BUN 10  CREATININE 1.11  CALCIUM 10.0    PT/INR: No results for input(s): LABPROT, INR in the last 72 hours. ABG No results found for: PHART, HCO3, TCO2, ACIDBASEDEF, O2SAT CBG (last 3)  No results for input(s): GLUCAP in the last 72 hours.  Assessment/Plan:  1. Chest tube- no air leak present, CXR looks stable, ? Tiny pneumothorax 2. Dispo- will d/c chest tube, repeat CXR at 12... If okay will d/c home today   LOS: 3 days    Xavier Howell, ERIN 03/05/2016   Chart reviewed, patient examined, agree with above.

## 2016-03-05 NOTE — Progress Notes (Signed)
Removed IV. No issues at present. Reviewed discharge instructions with patient. Awaiting wheelchair for discharge.  Imre Vecchione, Mervin Kung RN

## 2016-03-18 ENCOUNTER — Other Ambulatory Visit: Payer: Self-pay | Admitting: Surgery

## 2016-03-18 DIAGNOSIS — J939 Pneumothorax, unspecified: Secondary | ICD-10-CM

## 2016-03-20 ENCOUNTER — Ambulatory Visit: Payer: Self-pay | Admitting: Surgery

## 2016-03-20 ENCOUNTER — Encounter: Payer: Self-pay | Admitting: Surgery

## 2016-03-20 NOTE — Progress Notes (Unsigned)
This encounter was created in error - please disregard.  This encounter was created in error - please disregard.

## 2016-03-25 ENCOUNTER — Ambulatory Visit
Admission: RE | Admit: 2016-03-25 | Discharge: 2016-03-25 | Disposition: A | Discharge status: Home / Self Care | Payer: No Typology Code available for payment source | Source: Ambulatory Visit | Attending: Surgery | Admitting: Surgery

## 2016-03-25 ENCOUNTER — Ambulatory Visit (INDEPENDENT_AMBULATORY_CARE_PROVIDER_SITE_OTHER): Payer: Self-pay | Admitting: Surgical

## 2016-03-25 VITALS — BP 129/73 | HR 56 | Resp 20 | Ht 71.0 in | Wt 139.0 lb

## 2016-03-25 DIAGNOSIS — J939 Pneumothorax, unspecified: Secondary | ICD-10-CM

## 2016-03-25 NOTE — Patient Instructions (Signed)
Discussed activity progression. He has stopped smoking and have encouraged him to not resume.

## 2016-03-25 NOTE — Progress Notes (Signed)
[address]       [place] [address]             [phone]                  Maki Menken [facility] Medical Record [id number] Date of Birth: 28-Feb-1986  Referring [id number], Elberta Fortis, MD Primary Cardiology: Primary Care:No PCP Per Patient  Chief Complaint:  Follow Up Visit  History of Present Illness:     Patient is a 30 year old male was recently hospitalized for a spontaneous left-sided pneumothorax. He was treated with a chest tube with good clinical resolution. He is seen in the office on today's date in routine follow-up. He does say that his pulmonary stamina is not as it was prior to the event but is improving with time he is not having any other significant symptoms.        Zubrod Score: At the time of surgery this patient's most appropriate activity status/level should be described as: []     0    Normal activity, no symptoms []     1    Restricted in physical strenuous activity but ambulatory, able to do out light work []     2    Ambulatory and capable of self care, unable to do work activities, up and about                 >50 % of waking hours                                                                                   []     3    Only limited self care, in bed greater than 50% of waking hours []     4    Completely disabled, no self care, confined to bed or chair []     5    Moribund  History  Smoking status  . Current Every Day Smoker  Smokeless tobacco  . Not on file       No Known Allergies  Current Outpatient Prescriptions  Medication Sig Dispense Refill  . acetaminophen (TYLENOL) 325 MG tablet Take 2 tablets (650 mg total) by mouth every 6 (six) hours as needed for moderate pain.    Marland Kitchen ibuprofen (ADVIL,MOTRIN) 800 MG tablet Take 1 tablet (800 mg total) by mouth every 8 (eight) hours as needed for moderate pain. 30 tablet 0  . oxyCODONE (OXY IR/ROXICODONE) 5 MG immediate release tablet Take 1-2 tablets (5-10 mg total)  by mouth every 4 (four) hours as needed for moderate pain. 30 tablet 0   No current facility-administered medications for this visit.       Physical Exam: BP 129/73 mmHg  Pulse 56  Resp 20  Ht 5\' 11"  (1.803 m)  Wt 139 lb (63.05 kg)  BMI 19.40 kg/m2  SpO2 99%  General appearance: alert, cooperative and no distress Heart: regular rate and rhythm, S1, S2 normal, no murmur, click, rub or gallop Lungs: clear to auscultation bilaterally Wound: I remove the chest tube suture and it is healing well. Wounds:  Diagnostic Studies & Laboratory data:  Recent Radiology Findings: Dg Chest 2 View  03/25/2016  CLINICAL DATA:  Left-sided chest pain. History of left pneumothorax. EXAM: CHEST  2 VIEW COMPARISON:  Radiograph of March 05, 2016. FINDINGS: The heart size and mediastinal contours are within normal limits. Both lungs are clear. No pneumothorax or pleural effusion is noted. The visualized skeletal structures are unremarkable. IMPRESSION: No active cardiopulmonary disease. Electronically Signed   By: Marijo Conception, M.D.   On: 03/25/2016 14:41      I have independently reviewed the above radiology findings and reviewed findings  with the patient.  Recent Labs: Lab Results  Component Value Date   WBC 6.4 03/02/2016   HGB 14.4 03/02/2016   HCT 42.2 03/02/2016   PLT 215 03/02/2016   GLUCOSE 107* 03/02/2016   NA 136 03/02/2016   K 3.9 03/02/2016   CL 103 03/02/2016   CREATININE 1.11 03/02/2016   BUN 10 03/02/2016   CO2 25 03/02/2016      Assessment / Plan:  The patient has recovered well from a spontaneous pneumothorax. We discussed activity progression. We will see again in the office on a when necessary basis.The patient reports that he is no longer smoking cigarettes.        Luchiano Viscomi E 03/25/2016 2:50 PM

## 2016-04-10 ENCOUNTER — Ambulatory Visit: Payer: Self-pay | Admitting: Surgery

## 2017-02-17 ENCOUNTER — Inpatient Hospital Stay (HOSPITAL_COMMUNITY)
Admission: AD | Admit: 2017-02-17 | Discharge: 2017-02-21 | DRG: 885 | Disposition: A | Discharge status: Home / Self Care | Payer: No Typology Code available for payment source | Attending: Emergency Medicine | Admitting: Emergency Medicine

## 2017-02-17 ENCOUNTER — Encounter (HOSPITAL_COMMUNITY): Payer: Self-pay

## 2017-02-17 ENCOUNTER — Other Ambulatory Visit: Payer: Self-pay

## 2017-02-17 DIAGNOSIS — Z8349 Family history of other endocrine, nutritional and metabolic diseases: Secondary | ICD-10-CM

## 2017-02-17 DIAGNOSIS — Z818 Family history of other mental and behavioral disorders: Secondary | ICD-10-CM

## 2017-02-17 DIAGNOSIS — F332 Major depressive disorder, recurrent severe without psychotic features: Secondary | ICD-10-CM | POA: Diagnosis not present

## 2017-02-17 DIAGNOSIS — Z806 Family history of leukemia: Secondary | ICD-10-CM | POA: Diagnosis not present

## 2017-02-17 DIAGNOSIS — R9431 Abnormal electrocardiogram [ECG] [EKG]: Secondary | ICD-10-CM | POA: Diagnosis present

## 2017-02-17 DIAGNOSIS — G4700 Insomnia, unspecified: Secondary | ICD-10-CM | POA: Diagnosis present

## 2017-02-17 DIAGNOSIS — F17210 Nicotine dependence, cigarettes, uncomplicated: Secondary | ICD-10-CM | POA: Diagnosis present

## 2017-02-17 DIAGNOSIS — R45851 Suicidal ideations: Secondary | ICD-10-CM | POA: Diagnosis present

## 2017-02-17 DIAGNOSIS — R001 Bradycardia, unspecified: Secondary | ICD-10-CM | POA: Diagnosis present

## 2017-02-17 DIAGNOSIS — R45850 Homicidal ideations: Secondary | ICD-10-CM | POA: Diagnosis present

## 2017-02-17 DIAGNOSIS — Z8379 Family history of other diseases of the digestive system: Secondary | ICD-10-CM

## 2017-02-17 DIAGNOSIS — Z7151 Drug abuse counseling and surveillance of drug abuser: Secondary | ICD-10-CM | POA: Diagnosis not present

## 2017-02-17 DIAGNOSIS — Z62810 Personal history of physical and sexual abuse in childhood: Secondary | ICD-10-CM | POA: Diagnosis present

## 2017-02-17 DIAGNOSIS — F333 Major depressive disorder, recurrent, severe with psychotic symptoms: Principal | ICD-10-CM | POA: Diagnosis present

## 2017-02-17 DIAGNOSIS — F4310 Post-traumatic stress disorder, unspecified: Secondary | ICD-10-CM | POA: Diagnosis present

## 2017-02-17 DIAGNOSIS — Z811 Family history of alcohol abuse and dependence: Secondary | ICD-10-CM

## 2017-02-17 DIAGNOSIS — F1220 Cannabis dependence, uncomplicated: Secondary | ICD-10-CM | POA: Diagnosis present

## 2017-02-17 DIAGNOSIS — F411 Generalized anxiety disorder: Secondary | ICD-10-CM | POA: Diagnosis present

## 2017-02-17 MED ORDER — TRAZODONE HCL 50 MG PO TABS
50.0000 mg | ORAL_TABLET | Freq: Every evening | ORAL | Status: DC | PRN
  Filled 2017-02-17 (×6): qty 1

## 2017-02-17 MED ORDER — MAGNESIUM HYDROXIDE 400 MG/5ML PO SUSP: 30.0000 mL | Freq: Every day | ORAL | Status: DC | PRN

## 2017-02-17 MED ORDER — HYDROXYZINE HCL 25 MG PO TABS
25.0000 mg | ORAL_TABLET | Freq: Three times a day (TID) | ORAL | Status: DC | PRN
Start: 2017-02-17 — End: 2017-02-21
  Administered 2017-02-18 – 2017-02-19 (×2): 25 mg via ORAL
  Filled 2017-02-17: qty 1
  Filled 2017-02-17 (×2): qty 10
  Filled 2017-02-17: qty 1

## 2017-02-17 MED ORDER — ALUM & MAG HYDROXIDE-SIMETH 200-200-20 MG/5ML PO SUSP: 30.0000 mL | ORAL | Status: DC | PRN

## 2017-02-17 MED ORDER — ASPIRIN 81 MG PO CHEW
CHEWABLE_TABLET | ORAL | Status: AC
  Administered 2017-02-17: 324 mg via ORAL
  Filled 2017-02-17: qty 4

## 2017-02-17 MED ORDER — NICOTINE 21 MG/24HR TD PT24
21.0000 mg | MEDICATED_PATCH | Freq: Every day | TRANSDERMAL | Status: DC
  Administered 2017-02-18 – 2017-02-21 (×4): 21 mg via TRANSDERMAL
  Filled 2017-02-17 (×6): qty 1

## 2017-02-17 MED ORDER — ACETAMINOPHEN 325 MG PO TABS: 650.0000 mg | ORAL_TABLET | Freq: Four times a day (QID) | ORAL | Status: DC | PRN

## 2017-02-17 MED ORDER — ASPIRIN 81 MG PO CHEW
324.0000 mg | CHEWABLE_TABLET | Freq: Once | ORAL | Status: AC
  Administered 2017-02-17: 324 mg via ORAL
  Filled 2017-02-17: qty 4

## 2017-02-17 NOTE — H&P (Signed)
Behavioral Health Medical Screening Exam  Xavier Howell is an 31 y.o. male.  Total Time spent with patient: 30 minutes  Psychiatric Specialty Exam: Physical Exam  Constitutional: He is oriented to person, place, and time. He appears well-developed.  Neurological: He is alert and oriented to person, place, and time.  Psychiatric: He has a normal mood and affect. His behavior is normal.    Review of Systems  Psychiatric/Behavioral: Positive for suicidal ideas. The patient is nervous/anxious.     There were no vitals taken for this visit.There is no height or weight on file to calculate BMI.  General Appearance: Casual  Eye Contact:  Good  Speech:  Clear and Coherent  Volume:  Normal  Mood:  Anxious, Depressed and Dysphoric  Affect:  Depressed and Flat  Thought Process:  Coherent  Orientation:  Full (Time, Place, and Person)  Thought Content:  Hallucinations: Auditory  Suicidal Thoughts:  Yes.  without intent/plan  Homicidal Thoughts:  Yes.  with intent/plan  Memory:  Immediate;   Fair Recent;   Fair Remote;   Fair  Judgement:  Fair  Insight:  Fair  Psychomotor Activity:  Normal  Concentration: Concentration: Fair and Attention Span: Fair  Recall:  Wilson: Fair  Akathisia:  No  Handed:  Right  AIMS (if indicated):     Assets:  Communication Skills Desire for Improvement Resilience Social Support  Sleep:       Musculoskeletal: Strength & Muscle Tone: within normal limits Gait & Station: normal Patient leans: N/A  There were no vitals taken for this visit.  B/P 133/74  HR 53  RR 14 TEMP 98.3  Recommendations:  Based on my evaluation the patient does not appear to have an emergency medical condition.  Derrill Center, NP 02/17/2017, 6:58 PM

## 2017-02-17 NOTE — Significant Event (Cosign Needed)
S. Patient walk-in admitted to [facility], received EKG as part of baseline screening. EKG abn noted, marked bradycardia with ST elevation ANT/Lateral leads. Patient asymptomatic, denying CP, weakness, SOB or dizziness. Patient denies known cardiac hx or family hx of premature CAD/AMI.  O V/S P 38, Normotensive  Pulm: CTA  Cardo: Audible S1,S1 without M/G/R  Ext; Non edematous  Neuro: CN 2 - 12 intact  A/P: ST segment elevation/bradycardia  EMS notified  ASA 325 mg stat, O2 via [place]  Transported to [facility] ED for evaluation.

## 2017-02-17 NOTE — Tx Team (Signed)
Initial Treatment Plan 02/17/2017 10:41 PM Xavier Howell [id number]    PATIENT STRESSORS: Financial difficulties Marital or family conflict Substance abuse   PATIENT STRENGTHS: Curator fund of knowledge Physical Health Work skills   PATIENT IDENTIFIED PROBLEMS: Depression  Suicidal ideation  Cannabis abuse  "I need to learn balance"  "How not to get so deep in my head"             DISCHARGE CRITERIA:  Improved stabilization in mood, thinking, and/or behavior Motivation to continue treatment in a less acute level of care Verbal commitment to aftercare and medication compliance  PRELIMINARY DISCHARGE PLAN: Outpatient therapy Medication management  PATIENT/FAMILY INVOLVEMENT: This treatment plan has been presented to and reviewed with the patient, Xavier Howell.  The patient and family have been given the opportunity to ask questions and make suggestions.  Windell Moment, RN 02/17/2017, 10:41 PM

## 2017-02-17 NOTE — ED Provider Notes (Signed)
[facility] DEPT Provider Note   CSN: [id number] Arrival date & time: 02/17/17  2321  By signing my name below, I, Xavier Howell, attest that this documentation has been prepared under the direction and in the presence of Xavier Howell, Xavier Allegra, MD. Electronically Signed: Theresia Howell, ED Scribe. 02/17/17. 11:47 PM.  History   Chief Complaint Chief Complaint  Patient presents with  . Bradycardia   The history is provided by the patient. No language interpreter was used.   HPI Comments: Xavier Howell is a 31 y.o. male who presents to the Emergency Department via EMS complaining of moderate bradycardia onset this evening. Pt states his noticed his HR was at 46. Pt reports no associated symptoms. No treatments tried prior to arrival in the ED. Pt denies weakness, dizziness, irregular heart beat or any other complaints at this time.  History reviewed. No pertinent past medical history.  Patient Active Problem List   Diagnosis Date Noted  . MDD (major depressive disorder), recurrent severe, without psychosis ([place]) 02/17/2017  . Pneumothorax on left 03/02/2016    History reviewed. No pertinent surgical history.     Home Medications    Prior to Admission medications   Medication Sig Start Date End Date Taking? Authorizing Provider  acetaminophen (TYLENOL) 325 MG tablet Take 2 tablets (650 mg total) by mouth every 6 (six) hours as needed for moderate pain. Patient not taking: Reported on 02/18/2017 03/05/16   Barrett, Lodema Hong, PA-C  ibuprofen (ADVIL,MOTRIN) 800 MG tablet Take 1 tablet (800 mg total) by mouth every 8 (eight) hours as needed for moderate pain. Patient not taking: Reported on 02/18/2017 03/05/16   Barrett, Junie Panning R, PA-C  oxyCODONE (OXY IR/ROXICODONE) 5 MG immediate release tablet Take 1-2 tablets (5-10 mg total) by mouth every 4 (four) hours as needed for moderate pain. Patient not taking: Reported on 02/18/2017 03/05/16   Barrett, Lodema Hong, PA-C    Family  History Family History  Problem Relation Age of Onset  . Alcoholism Mother   . Cirrhosis Mother   . Cancer Sister   . Cirrhosis Brother   . Lupus Other     Social History Social History  Substance Use Topics  . Smoking status: Current Every Day Smoker    Packs/day: 0.50  . Smokeless tobacco: Never Used  . Alcohol use No     Comment: socially     Allergies   Patient has no known allergies.   Review of Systems Review of Systems  Cardiovascular: Positive for palpitations.  Neurological: Negative for dizziness and weakness.  All other systems reviewed and are negative.    Physical Exam Updated Vital Signs BP (!) 141/77 (BP Location: Left Arm)   Pulse (!) 57   Temp 98.1 F (36.7 C) (Oral)   Resp 18   Ht 5\' 11"  (1.803 m)   Wt 129 lb (58.5 kg)   SpO2 100%   BMI 17.99 kg/m   Physical Exam  Constitutional: He is oriented to person, place, and time. He appears well-developed and well-nourished. No distress.  HENT:  Head: Normocephalic and atraumatic.  Right Ear: Hearing normal.  Left Ear: Hearing normal.  Nose: Nose normal.  Mouth/Throat: Oropharynx is clear and moist and mucous membranes are normal.  Eyes: Conjunctivae and EOM are normal. Pupils are equal, round, and reactive to light.  Neck: Normal range of motion. Neck supple.  Cardiovascular: Regular rhythm, S1 normal, S2 normal and normal heart sounds.  Exam reveals no gallop and no friction rub.   No  murmur heard. Bradycardia  Pulmonary/Chest: Effort normal and breath sounds normal. No respiratory distress. He exhibits no tenderness.  Abdominal: Soft. Normal appearance and bowel sounds are normal. There is no hepatosplenomegaly. There is no tenderness. There is no rebound, no guarding, no tenderness at McBurney's point and negative Murphy's sign. No hernia.  Musculoskeletal: Normal range of motion.  Neurological: He is alert and oriented to person, place, and time. He has normal strength. No cranial nerve  deficit or sensory deficit. Coordination normal. GCS eye subscore is 4. GCS verbal subscore is 5. GCS motor subscore is 6.  Skin: Skin is warm, dry and intact. No rash noted. No cyanosis.  Psychiatric: He has a normal mood and affect. His speech is normal and behavior is normal. Thought content normal.  Nursing note and vitals reviewed.    ED Treatments / Results  DIAGNOSTIC STUDIES: Oxygen Saturation is 100% on RA, normal by my interpretation.   COORDINATION OF CARE: 11:40 PM-Discussed next steps with pt including blood work and an EKG. Pt verbalized understanding and is agreeable with the plan.   Labs (all labs ordered are listed, but only abnormal results are displayed) Labs Reviewed  CBC - Abnormal; Notable for the following:       Result Value   RBC 4.12 (*)    HCT 38.4 (*)    All other components within normal limits  COMPREHENSIVE METABOLIC PANEL - Abnormal; Notable for the following:    Potassium 3.3 (*)    Total Protein 6.0 (*)    ALT 12 (*)    All other components within normal limits  ETHANOL  TSH  HEMOGLOBIN A1C  PROLACTIN  RAPID URINE DRUG SCREEN, HOSP PERFORMED    EKG  EKG Interpretation None       Radiology No results found.  Procedures Procedures (including critical care time)  Medications Ordered in ED Medications  acetaminophen (TYLENOL) tablet 650 mg (not administered)  alum & mag hydroxide-simeth (MAALOX/MYLANTA) 200-200-20 MG/5ML suspension 30 mL (not administered)  magnesium hydroxide (MILK OF MAGNESIA) suspension 30 mL (not administered)  traZODone (DESYREL) tablet 50 mg (not administered)  hydrOXYzine (ATARAX/VISTARIL) tablet 25 mg (not administered)  nicotine (NICODERM CQ - dosed in mg/24 hours) patch 21 mg (not administered)  aspirin chewable tablet 324 mg (324 mg Oral Given 02/17/17 2215)     Initial Impression / Assessment and Plan / ED Course  I have reviewed the triage vital signs and the nursing notes.  Pertinent labs &  imaging results that were available during my care of the patient were reviewed by me and considered in my medical decision making (see chart for details).     Patient presents to the ER for evaluation of bradycardia. Patient is currently a patient at behavioral health Hospital. Routine vital sign check revealed a heart rate of 46. This prompted him to be sent to the ER for evaluation. Patient is asymptomatic. Blood pressure is normal. Reviewing multiple records from previous visits reveals that his normal resting heart rate is around 50. He has been monitored for an extended period of time without any true arrhythmia. Patient is safe for return to behavioral health Hospital.  Final Clinical Impressions(s) / ED Diagnoses   Final diagnoses:  Bradycardia, sinus    New Prescriptions New Prescriptions   No medications on file   I personally performed the services described in this documentation, which was scribed in my presence. The recorded information has been reviewed and is accurate.     Joseph Berkshire  J, MD 02/18/17 [id number]

## 2017-02-17 NOTE — Progress Notes (Signed)
Called 911 due to EKG abnormalities per Patriciaann Clan PA.  Called [place] charge nurse to give report.
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
## 2017-02-17 NOTE — Progress Notes (Signed)
EKG completed, shown to Patriciaann Clan PA for review.  S[place]cer ordered for pt to go to ED for evaluation, apply O2 at 2 L per minute and aspirin ordered.

## 2017-02-18 DIAGNOSIS — F333 Major depressive disorder, recurrent, severe with psychotic symptoms: Secondary | ICD-10-CM

## 2017-02-18 DIAGNOSIS — F332 Major depressive disorder, recurrent severe without psychotic features: Secondary | ICD-10-CM

## 2017-02-18 DIAGNOSIS — Z811 Family history of alcohol abuse and dependence: Secondary | ICD-10-CM

## 2017-02-18 DIAGNOSIS — Z818 Family history of other mental and behavioral disorders: Secondary | ICD-10-CM

## 2017-02-18 LAB — COMPREHENSIVE METABOLIC PANEL
ALBUMIN: 4.7 g/dL (ref 3.5–5.0)
ALT: 12 U/L — ABNORMAL LOW (ref 17–63)
ALT: 13 U/L — AB (ref 17–63)
ANION GAP: 7 (ref 5–15)
AST: 16 U/L (ref 15–41)
AST: 18 U/L (ref 15–41)
Albumin: 3.8 g/dL (ref 3.5–5.0)
Alkaline Phosphatase: 48 U/L (ref 38–126)
Alkaline Phosphatase: 61 U/L (ref 38–126)
Anion gap: 8 (ref 5–15)
BUN: 11 mg/dL (ref 6–20)
BUN: 15 mg/dL (ref 6–20)
CHLORIDE: 104 mmol/L (ref 101–111)
CO2: 25 mmol/L (ref 22–32)
CO2: 28 mmol/L (ref 22–32)
CREATININE: 1.07 mg/dL (ref 0.61–1.24)
Calcium: 8.9 mg/dL (ref 8.9–10.3)
Calcium: 9.4 mg/dL (ref 8.9–10.3)
Chloride: 102 mmol/L (ref 101–111)
Creatinine, Ser: 1.09 mg/dL (ref 0.61–1.24)
GFR calc Af Amer: >60 mL/min (ref >60)
GFR calc Af Amer: >60 mL/min (ref >60)
GFR calc non Af Amer: >60 mL/min (ref >60)
GLUCOSE: 94 mg/dL (ref 65–99)
GLUCOSE: 127 mg/dL — AB (ref 65–99)
POTASSIUM: 3.3 mmol/L — AB (ref 3.5–5.1)
POTASSIUM: 3.8 mmol/L (ref 3.5–5.1)
SODIUM: 138 mmol/L (ref 135–145)
Sodium: 136 mmol/L (ref 135–145)
TOTAL PROTEIN: 6 g/dL — AB (ref 6.5–8.1)
Total Bilirubin: 0.7 mg/dL (ref 0.3–1.2)
Total Bilirubin: 0.3 mg/dL (ref 0.3–1.2)
Total Protein: 7.7 g/dL (ref 6.5–8.1)

## 2017-02-18 LAB — TSH
TSH: 1.153 u[IU]/mL (ref 0.350–4.500)
TSH: 1.221 u[IU]/mL (ref 0.350–4.500)

## 2017-02-18 LAB — RAPID URINE DRUG SCREEN, HOSP PERFORMED
AMPHETAMINES: NOT DETECTED
Amphetamines: NOT DETECTED
BARBITURATES: NOT DETECTED
Barbiturates: NOT DETECTED
Benzodiazepines: NOT DETECTED
Benzodiazepines: NOT DETECTED
COCAINE: NOT DETECTED
COCAINE: NOT DETECTED
Opiates: NOT DETECTED
Opiates: NOT DETECTED
TETRAHYDROCANNABINOL: POSITIVE — AB
TETRAHYDROCANNABINOL: POSITIVE — AB

## 2017-02-18 LAB — CBC
HCT: 42 % (ref 39.0–52.0)
HEMATOCRIT: 38.4 % — AB (ref 39.0–52.0)
Hemoglobin: 13 g/dL (ref 13.0–17.0)
Hemoglobin: 14.7 g/dL (ref 13.0–17.0)
MCH: 31.6 pg (ref 26.0–34.0)
MCH: 32.5 pg (ref 26.0–34.0)
MCHC: 33.9 g/dL (ref 30.0–36.0)
MCHC: 35 g/dL (ref 30.0–36.0)
MCV: 93.2 fL (ref 78.0–100.0)
MCV: 92.9 fL (ref 78.0–100.0)
PLATELETS: 204 10*3/uL (ref 150–400)
PLATELETS: 240 10*3/uL (ref 150–400)
RBC: 4.12 MIL/uL — ABNORMAL LOW (ref 4.22–5.81)
RBC: 4.52 MIL/uL (ref 4.22–5.81)
RDW: 12.5 % (ref 11.5–15.5)
RDW: 12.7 % (ref 11.5–15.5)
WBC: 5 10*3/uL (ref 4.0–10.5)
WBC: 5.8 10*3/uL (ref 4.0–10.5)

## 2017-02-18 LAB — ETHANOL

## 2017-02-18 MED ORDER — ADULT MULTIVITAMIN W/MINERALS CH
1.0000 | ORAL_TABLET | Freq: Every day | ORAL | Status: DC
  Administered 2017-02-18 – 2017-02-21 (×4): 1 via ORAL
  Filled 2017-02-18 (×7): qty 1

## 2017-02-18 MED ORDER — NICOTINE 21 MG/24HR TD PT24: 21.0000 mg | MEDICATED_PATCH | Freq: Every day | TRANSDERMAL | 0 refills | Status: DC

## 2017-02-18 MED ORDER — FLUOXETINE HCL 10 MG PO CAPS
10.0000 mg | ORAL_CAPSULE | Freq: Every day | ORAL | Status: DC
  Administered 2017-02-18 – 2017-02-19 (×2): 10 mg via ORAL
  Filled 2017-02-18 (×6): qty 1

## 2017-02-18 MED ORDER — ACETAMINOPHEN 325 MG PO TABS: 650.0000 mg | ORAL_TABLET | Freq: Four times a day (QID) | ORAL | Status: AC | PRN

## 2017-02-18 MED ORDER — ENSURE ENLIVE PO LIQD
237.0000 mL | Freq: Two times a day (BID) | ORAL | Status: DC
  Administered 2017-02-18 – 2017-02-21 (×5): 237 mL via ORAL

## 2017-02-18 MED ORDER — OLANZAPINE 5 MG PO TABS
5.0000 mg | ORAL_TABLET | Freq: Every day | ORAL | Status: DC
  Administered 2017-02-18: 5 mg via ORAL
  Filled 2017-02-18 (×2): qty 1
  Filled 2017-02-18: qty 2
  Filled 2017-02-18: qty 1

## 2017-02-18 MED ORDER — HYDROXYZINE HCL 25 MG PO TABS: 25.0000 mg | ORAL_TABLET | Freq: Three times a day (TID) | ORAL | 0 refills | Status: DC | PRN

## 2017-02-18 MED ORDER — TRAZODONE HCL 50 MG PO TABS
50.0000 mg | ORAL_TABLET | Freq: Every evening | ORAL | Status: DC | PRN
  Administered 2017-02-18 – 2017-02-20 (×3): 50 mg via ORAL
  Filled 2017-02-18: qty 7
  Filled 2017-02-18 (×2): qty 1

## 2017-02-18 MED ORDER — POTASSIUM CHLORIDE CRYS ER 10 MEQ PO TBCR
10.0000 meq | EXTENDED_RELEASE_TABLET | Freq: Two times a day (BID) | ORAL | Status: AC
  Administered 2017-02-18 – 2017-02-19 (×3): 10 meq via ORAL
  Filled 2017-02-18 (×4): qty 1

## 2017-02-18 MED ORDER — TRAZODONE HCL 50 MG PO TABS: ORAL_TABLET | ORAL | 0 refills | Status: DC

## 2017-02-18 MED ORDER — TRAZODONE HCL 50 MG PO TABS
50.0000 mg | ORAL_TABLET | Freq: Every day | ORAL | Status: DC
  Filled 2017-02-18: qty 7

## 2017-02-18 NOTE — Progress Notes (Signed)
DAR NOTE: Patient presents with calm anxious affect and mood.  Denies pain, auditory and visual hallucinations.  Described energy level as low and concentration as good.  Rates depression at 7, hopelessness at 3, and anxiety at 5.  Maintained on routine safety checks.  Medications given as prescribed.  Support and encouragement offered as needed.  Attended group and participated.  States goal for today is "emotional control."  Patient observed socializing with peers in the dayroom.  Offered no complaint.

## 2017-02-18 NOTE — Progress Notes (Signed)
Adult Psychoeducational Group Note  Date:  02/18/2017 Time:  9:55 PM  Group Topic/Focus:  Wrap-Up Group:   The focus of this group is to help patients review their daily goal of treatment and discuss progress on daily workbooks.  Participation Level:  Active  Participation Quality:  Appropriate  Affect:  Appropriate  Cognitive:  Alert  Insight: Appropriate  Engagement in Group:  Engaged  Modes of Intervention:  Discussion  Additional Comments:  Patient stated having a good day. Patient's goal for today was to step out is comfort zone. Patient met goal.   Nyan Dufresne L Saif Peter 02/18/2017, 9:55 PM

## 2017-02-18 NOTE — ED Notes (Signed)
Patient transferred back to [facility] via [place] notified of patient departure.

## 2017-02-18 NOTE — BHH Suicide Risk Assessment (Addendum)
[place] INPATIENT:  Family/Significant Other Suicide Prevention Education  Suicide Prevention Education:  Education Completed; Xavier Howell, Pt's significant other [phone], has been identified by the patient as the family member/significant other with whom the patient will be residing, and identified as the person(s) who will aid the patient in the event of a mental health crisis (suicidal ideations/suicide attempt).  With written consent from the patient, the family member/significant other has been provided the following suicide prevention education, prior to the and/or following the discharge of the patient.  The suicide prevention education provided includes the following:  Suicide risk factors  Suicide prevention and interventions  National Suicide Hotline telephone number  [facility] assessment telephone number  [facility] Emergency Assistance [place] and/or Residential Mobile Crisis Unit telephone number  Request made of family/significant other to:  Remove weapons (e.g., guns, rifles, knives), all items previously/currently identified as safety concern.    Remove drugs/medications (over-the-counter, prescriptions, illicit drugs), all items previously/currently identified as a safety concern.  The family member/significant other verbalizes understanding of the suicide prevention education information provided.  The family member/significant other agrees to remove the items of safety concern listed above.  Pt's significant other says that Pt can return home. Pt's mother reports that Pt's symptoms began to increase after the death of his younger sister two years ago. Significant other is willing to be supportive in the outpatient process.    Gladstone Lighter 02/18/2017, 1:48 PM

## 2017-02-18 NOTE — Progress Notes (Signed)
Recreation Therapy Notes  INPATIENT RECREATION THERAPY ASSESSMENT  Patient Details Name: Xavier Howell MRN: [id number] DOB: [birth year] Today's Date: 2017-02-22  Patient Stressors: Death, Other (Comment) (financia;, lack of feeling needed, important and failing)  Pt stated he was here for suicidal thoughts, H/I and depression.  Coping Skills:   Isolate, Arguments, Substance Abuse, Avoidance, Exercise, Art/Dance, Talking, Music  Personal Challenges: Anger, Concentration, Decision-Making, Expressing Yourself, Problem-Solving, Relationships, Stress Management, Time Management, Trusting Others, Work Midwife (2+):  Music - Listen, Community - Other (Comment), Individual - Other (Comment) (Bowling, cooking, smoking)  Awareness of Community Resources:  Yes  Community Resources:  Sondra Barges, [place], Other (Comment) (Sports plex)  Current Use: Yes  Patient Strengths:  Love to talk/people person; care  Patient Identified Areas of Improvement:  Self worth; anger/depression  Current Recreation Participation:  Everyday  Patient Goal for Hospitalization:  "Find my happy place again"  [place] of Residence:  [place] of Residence:  [place]  Current [place] (including self-harm):  No  Current HI:  No  Consent to Intern Participation: N/A   Victorino Sparrow, LRT/CTRS  Victorino Sparrow A 02-22-2017, 2:29 PM

## 2017-02-18 NOTE — Progress Notes (Signed)
Recreation Therapy Notes  Date: 02/18/17 Time: 1000 Location: 500 [address] Dayroom  Group Topic: Coping Skills  Goal Area(s) Addresses:  Patients will be able to identify positive coping skills. Patients will be able to identify the benefits of using coping skills. Patients will be able to identify benefits of using coping skills post d/c.  Behavioral Response: Engaged  Intervention: Coping Skills  Activity: Building surveyor.  Patients were to picture themselves stuck to the center of the web.  Patients were to then write the all the things and situations they feel have them stuck within the web.  Lastly, patients were to then identify the coping skills they can use to combat the things that have them stuck.  Education:Coping Skills, Discharge Planning.   Education Outcome: Acknowledges understanding/In group clarification offered/Needs additional education.   Clinical Observations/Feedback: Pt stated using negative coping skills is easier because it's your first reaction.  Pt went on to explain "even if both of you apologize to each other, the reason for the argument still lingers because you can think about the things that were said and now remember something from the argument that pissed you off".  Pt stated the things that have him stuck are depression, anger, housing, work, finances and family.  Pt explained his coping skills are walking, poetry, music, exercise, praying and talking to someone.    Victorino Sparrow, LRT/CTRS        Victorino Sparrow A 02/18/2017 12:23 PM
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
## 2017-02-18 NOTE — Progress Notes (Signed)
Nursing Progress Note: 7p-7a D: Pt currently presents with a euthymic/pleasant/grandiose affect and behavior. Pt states "I have had a great day. I hope that the rest of my days are this fantastical." Interacting appropriately with milieu. Pt reports good sleep with current medication regimen.   A: Pt provided with medications per providers orders. Pt's labs and vitals were monitored throughout the night. Pt supported emotionally and encouraged to express concerns and questions. Pt educated on medications.  R: Pt's safety ensured with 15 minute and environmental checks. Pt currently denies SI/HI/Self Harm and AVH. Pt verbally contracts to seek staff if SI/HI or A/VH occurs and to consult with staff before acting on any harmful thoughts. Will continue to monitor.
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
## 2017-02-18 NOTE — [facility] Group Notes (Signed)
[facility] [place] LCSW Group Therapy  02/18/2017 4:28 PM   Type of Therapy:  Group Therapy  Participation Level:  Active  Participation Quality:  Attentive  Affect:  Appropriate  Cognitive:  Appropriate  Insight:  Improving  Engagement in Therapy:  Engaged  Modes of Intervention:  Clarification, Education, Exploration and Socialization  Summary of Progress/Problems: Today's group focused on resilience.  Stayed the entire time, engaged throughout.  "I was resilient when I lost my sister, and more recently my aunt.  With my sister, it wasn't easy, but I began doing some of the things that we used to enjoy doing together, and it became like a celebration of her life rather than a downer. With my aunt, I asked for help by coming here so that I would not go downhill further." Xavier Howell 02/18/2017 , 4:28 PM
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
## 2017-02-18 NOTE — Progress Notes (Signed)
NUTRITION ASSESSMENT  Pt identified as at risk on the Malnutrition Screen Tool  INTERVENTION: 1. Educated patient on the importance of nutrition and encouraged intake of food and beverages. 2. Discussed weight goals. 3. Supplements:  - will order Ensure Enlive BID, each supplement provides 350 kcal and 20 grams of protein - will order daily multivitamin with minerals.   NUTRITION DIAGNOSIS: Underweight related to social/environmental circumstances, depression, SI as evidenced by current BMI (17.99 kg/m2).  Goal: Pt to meet >/= 90% of their estimated nutrition needs.  Monitor:  PO intake  Assessment:  Pt admitted for SI, HI, and psychosis. Pt has lost 10 lbs (7.2% body weight) in the past ~1 year which is not significant for time frame but is significant considering current underweight status. Will order supplements as outlined above. Continue to encourage PO intakes of meals, supplements, and snacks.    31 y.o. male  Height: Ht Readings from Last 1 Encounters:  02/17/17 5\' 11"  (1.803 m)    Weight: Wt Readings from Last 1 Encounters:  02/17/17 129 lb (58.5 kg)    Weight Hx: Wt Readings from Last 10 Encounters:  02/17/17 129 lb (58.5 kg)  03/25/16 139 lb (63 kg)  03/02/16 137 lb (62.1 kg)    BMI:  Body mass index is 17.99 kg/m. Pt meets criteria for underweight based on current BMI.  Estimated Nutritional Needs: Kcal: 25-30 kcal/kg Protein: > 1 gram protein/kg Fluid: 1 ml/kcal  Diet Order: Diet Heart Room service appropriate? Yes; Fluid consistency: Thin Pt is also offered choice of unit snacks mid-morning and mid-afternoon.  Pt is eating as desired.   Lab results and medications reviewed.     Jarome Matin, MS, RD, LDN, [facility] Inpatient Clinical Dietitian Pager # [phone] After hours/weekend pager # [phone]

## 2017-02-19 DIAGNOSIS — F17210 Nicotine dependence, cigarettes, uncomplicated: Secondary | ICD-10-CM

## 2017-02-19 DIAGNOSIS — F333 Major depressive disorder, recurrent, severe with psychotic symptoms: Principal | ICD-10-CM

## 2017-02-19 DIAGNOSIS — F4310 Post-traumatic stress disorder, unspecified: Secondary | ICD-10-CM | POA: Diagnosis present

## 2017-02-19 DIAGNOSIS — F1220 Cannabis dependence, uncomplicated: Secondary | ICD-10-CM | POA: Clinically undetermined

## 2017-02-19 LAB — HEMOGLOBIN A1C
HEMOGLOBIN A1C: 5.8 % — AB (ref 4.8–5.6)
Hgb A1c MFr Bld: 5.7 % — ABNORMAL HIGH (ref 4.8–5.6)
MEAN PLASMA GLUCOSE: 120 mg/dL
Mean Plasma Glucose: 117 mg/dL

## 2017-02-19 LAB — LIPID PANEL
CHOL/HDL RATIO: 2.8 ratio
Cholesterol: 177 mg/dL (ref 0–200)
HDL: 64 mg/dL (ref >40)
LDL Cholesterol: 100 mg/dL — ABNORMAL HIGH (ref 0–99)
TRIGLYCERIDES: 65 mg/dL (ref <150)
VLDL: 13 mg/dL (ref 0–40)

## 2017-02-19 LAB — PROLACTIN
Prolactin: 8.9 ng/mL (ref 4.0–15.2)
Prolactin: 12.6 ng/mL (ref 4.0–15.2)

## 2017-02-19 MED ORDER — FLUOXETINE HCL 20 MG PO CAPS
20.0000 mg | ORAL_CAPSULE | Freq: Every day | ORAL | Status: DC
  Administered 2017-02-20 – 2017-02-21 (×2): 20 mg via ORAL
  Filled 2017-02-19: qty 7
  Filled 2017-02-19 (×4): qty 1

## 2017-02-19 MED ORDER — OLANZAPINE 7.5 MG PO TABS
7.5000 mg | ORAL_TABLET | Freq: Every day | ORAL | Status: DC
  Administered 2017-02-19 – 2017-02-20 (×2): 7.5 mg via ORAL
  Filled 2017-02-19: qty 7
  Filled 2017-02-19 (×4): qty 1

## 2017-02-19 MED ORDER — OLANZAPINE 5 MG PO TABS
5.0000 mg | ORAL_TABLET | Freq: Two times a day (BID) | ORAL | Status: DC | PRN
Start: 2017-02-19 — End: 2017-02-21
  Administered 2017-02-20: 5 mg via ORAL
  Filled 2017-02-19: qty 2

## 2017-02-19 MED ORDER — OLANZAPINE 10 MG IM SOLR: 5.0000 mg | Freq: Two times a day (BID) | INTRAMUSCULAR | Status: DC | PRN

## 2017-02-19 NOTE — Progress Notes (Signed)
[place] Group Notes:  (Nursing/MHT/Case Management/Adjunct)  Date:  02/19/2017  Time:  9:17 PM  Type of Therapy:  Psychoeducational Skills  Participation Level:  Active  Participation Quality:  Appropriate  Affect:  Appropriate  Cognitive:  Appropriate  Insight:  Good  Engagement in Group:  Engaged  Modes of Intervention:  Education  Summary of Progress/Problems: The patient expressed in group that he had a good day overall and that he talked and joked with his peers. His goal for tomorrow is to speak with the case manager about his discharge plans.   Archie Balboa S 02/19/2017, 9:17 PM

## 2017-02-19 NOTE — Progress Notes (Signed)
Recreation Therapy Notes  Date: 02/19/17 Time: 1000 Location: 500 Hall Dayroom  Group Topic: Self-Esteem  Goal Area(s) Addresses:  Patient will identify positive ways to increase self-esteem. Patient will verbalize benefit of increased self-esteem.  Behavioral Response: Engaged  Intervention: Colored pencils, blank crest  Activity: Crest of Arms.  Patients were to fill in the crest with things they value, things they love or anything they feel represents them.  Education:  Self-Esteem, Dentist.   Education Outcome: Acknowledges education/In group clarification offered/Needs additional education  Clinical Observations/Feedback: Pt arrived during processing.  Pt stated his proudest moments were being the first male in his family to graduate from high school and college; his kids; taking care of his sister's kids and being able to say "I need help and going to get it".  Pt stated thinking of these things will encourage him to get better so his kids can be proud of him and not view him as a failure.   Victorino Sparrow, LRT/CTRS         Victorino Sparrow A 02/19/2017 12:17 PM

## 2017-02-19 NOTE — Tx Team (Signed)
Interdisciplinary Treatment and Diagnostic Plan Update  02/19/2017 Time of Session: 8:49 AM  Xavier Howell MRN: [id number]  Principal Diagnosis: <principal problem not specified>  Secondary Diagnoses: Active Problems:   MDD (major depressive disorder), recurrent severe, without psychosis ([place])   Severe recurrent major depressive disorder with psychotic features ([place])   Current Medications:  Current Facility-Administered Medications  Medication Dose Route Frequency Provider Last Rate Last Dose  . acetaminophen (TYLENOL) tablet 650 mg  650 mg Oral Q6H PRN Derrill Center, NP      . alum & mag hydroxide-simeth (MAALOX/MYLANTA) 200-200-20 MG/5ML suspension 30 mL  30 mL Oral Q4H PRN Derrill Center, NP      . feeding supplement (ENSURE ENLIVE) (ENSURE ENLIVE) liquid 237 mL  237 mL Oral BID BM Cobos, Myer Peer, MD   237 mL at 02/19/17 0847  . FLUoxetine (PROZAC) capsule 10 mg  10 mg Oral Daily Cobos, Myer Peer, MD   10 mg at 02/19/17 0847  . hydrOXYzine (ATARAX/VISTARIL) tablet 25 mg  25 mg Oral TID PRN Derrill Center, NP   25 mg at 02/18/17 2143  . magnesium hydroxide (MILK OF MAGNESIA) suspension 30 mL  30 mL Oral Daily PRN Derrill Center, NP      . multivitamin with minerals tablet 1 tablet  1 tablet Oral Daily Cobos, Myer Peer, MD   1 tablet at 02/19/17 0847  . nicotine (NICODERM CQ - dosed in mg/24 hours) patch 21 mg  21 mg Transdermal Daily Ursula Alert, MD   21 mg at 02/19/17 0848  . OLANZapine (ZYPREXA) tablet 5 mg  5 mg Oral QHS Cobos, Myer Peer, MD   5 mg at 02/18/17 2142  . potassium chloride (K-DUR,KLOR-CON) CR tablet 10 mEq  10 mEq Oral BID Cobos, Myer Peer, MD   10 mEq at 02/19/17 0847  . traZODone (DESYREL) tablet 50 mg  50 mg Oral QHS PRN Cobos, Myer Peer, MD   50 mg at 02/18/17 2143    PTA Medications: Prescriptions Prior to Admission  Medication Sig Dispense Refill Last Dose  . ibuprofen (ADVIL,MOTRIN) 800 MG tablet Take 1 tablet (800 mg total) by mouth  every 8 (eight) hours as needed for moderate pain. (Patient not taking: Reported on 02/18/2017) 30 tablet 0 Completed Course at Unknown time  . oxyCODONE (OXY IR/ROXICODONE) 5 MG immediate release tablet Take 1-2 tablets (5-10 mg total) by mouth every 4 (four) hours as needed for moderate pain. (Patient not taking: Reported on 02/18/2017) 30 tablet 0 Completed Course at Unknown time  . [DISCONTINUED] acetaminophen (TYLENOL) 325 MG tablet Take 2 tablets (650 mg total) by mouth every 6 (six) hours as needed for moderate pain. (Patient not taking: Reported on 02/18/2017)   Completed Course at Unknown time    Treatment Modalities: Medication Management, Group therapy, Case management,  1 to 1 session with clinician, Psychoeducation, Recreational therapy.   Physician Treatment Plan for Primary Diagnosis: <principal problem not specified> Long Term Goal(s): Improvement in symptoms so as ready for discharge  Short Term Goals: Ability to identify changes in lifestyle to reduce recurrence of condition will improve Ability to verbalize feelings will improve Ability to disclose and discuss suicidal ideas Ability to demonstrate self-control will improve Ability to identify and develop effective coping behaviors will improve Compliance with prescribed medications will improve Ability to identify triggers associated with substance abuse/mental health issues will improve  Medication Management: Evaluate patient's response, side effects, and tolerance of medication regimen.  Therapeutic Interventions: 1 to  1 sessions, Unit Group sessions and Medication administration.  Evaluation of Outcomes: Progressing  Physician Treatment Plan for Secondary Diagnosis: Active Problems:   MDD (major depressive disorder), recurrent severe, without psychosis ([place])   Severe recurrent major depressive disorder with psychotic features ([place])   Long Term Goal(s): Improvement in symptoms so as ready for discharge  Short Term  Goals: Ability to identify changes in lifestyle to reduce recurrence of condition will improve Ability to verbalize feelings will improve Ability to disclose and discuss suicidal ideas Ability to demonstrate self-control will improve Ability to identify and develop effective coping behaviors will improve Compliance with prescribed medications will improve Ability to identify triggers associated with substance abuse/mental health issues will improve  Medication Management: Evaluate patient's response, side effects, and tolerance of medication regimen.  Therapeutic Interventions: 1 to 1 sessions, Unit Group sessions and Medication administration.  Evaluation of Outcomes: Progressing   RN Treatment Plan for Primary Diagnosis: <principal problem not specified> Long Term Goal(s): Knowledge of disease and therapeutic regimen to maintain health will improve  Short Term Goals: Ability to identify and develop effective coping behaviors will improve and Compliance with prescribed medications will improve  Medication Management: RN will administer medications as ordered by provider, will assess and evaluate patient's response and provide education to patient for prescribed medication. RN will report any adverse and/or side effects to prescribing provider.  Therapeutic Interventions: 1 on 1 counseling sessions, Psychoeducation, Medication administration, Evaluate responses to treatment, Monitor vital signs and CBGs as ordered, Perform/monitor CIWA, COWS, AIMS and Fall Risk screenings as ordered, Perform wound care treatments as ordered.  Evaluation of Outcomes: Progressing   LCSW Treatment Plan for Primary Diagnosis: <principal problem not specified> Long Term Goal(s): Safe transition to appropriate next level of care at discharge, Engage patient in therapeutic group addressing interpersonal concerns.  Short Term Goals: Engage patient in aftercare planning with referrals and  resources  Therapeutic Interventions: Assess for all discharge needs, 1 to 1 time with Social worker, Explore available resources and support systems, Assess for adequacy in community support network, Educate family and significant other(s) on suicide prevention, Complete Psychosocial Assessment, Interpersonal group therapy.  Evaluation of Outcomes: Met  Return home, follow up outpt   Progress in Treatment: Attending groups: Yes Participating in groups: Yes Taking medication as prescribed: Yes Toleration medication: Yes, no side effects reported at this time Family/Significant other contact made: No Patient understands diagnosis: Yes AEB asking for help with voices Discussing patient identified problems/goals with staff: Yes Medical problems stabilized or resolved: Yes Denies suicidal/homicidal ideation: Yes Issues/concerns per patient self-inventory: None Other: N/A  New problem(s) identified: None identified at this time.   New Short Term/Long Term Goal(s): None identified at this time.   Discharge Plan or Barriers:   Reason for Continuation of Hospitalization: Anxiety Depression Hallucinations Medication stabilization   Estimated Length of Stay: 3-5 days  Attendees: Patient: 02/19/2017  8:49 AM  Physician: Ursula Alert, MD 02/19/2017  8:49 AM  Nursing: Darrol Angel, RN 02/19/2017  8:49 AM  RN Care Manager: Lars Pinks, RN 02/19/2017  8:49 AM  Social Worker: Ripley Fraise 02/19/2017  8:49 AM  Recreational Therapist: Laretta Bolster  02/19/2017  8:49 AM  Other: Norberto Sorenson 02/19/2017  8:49 AM  Other:  02/19/2017  8:49 AM    Scribe for Treatment Team:  Roque Lias LCSW 02/19/2017 8:49 AM
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
## 2017-02-19 NOTE — Progress Notes (Signed)
Patient ID: Xavier Howell, male   DOB: 07-Oct-1985, 31 y.o.   MRN: [id number]  DAR: Pt. Denies SI/HI and A/V Hallucinations. He reports sleep is good, appetite is good, energy level is normal, and concentration is good. He rates depression 2/10, hopelessness 0/10, and anxiety 2/10. Patient does report pain in his right shoulder that is chronic in nature, he requested a heat pack to "work it out" and received two. Support and encouragement provided to the patient. Patient is seen in the milieu and is attending groups. Patient is minimal but cooperative at this time. Q15 minute checks are maintained for safety.

## 2017-02-19 NOTE — BHH Group Notes (Signed)
[place] Group Notes:  (Counselor/Nursing/MHT/Case Management/Adjunct)  02/19/2017 1:15PM  Type of Therapy:  Group Therapy  Participation Level:  Active  Participation Quality:  Appropriate  Affect:  Flat  Cognitive:  Oriented  Insight:  Improving  Engagement in Group:  Limited  Engagement in Therapy:  Limited  Modes of Intervention:  Discussion, Exploration and Socialization  Summary of Progress/Problems: The topic for group was balance in life.  Pt participated in the discussion about when their life was in balance and out of balance and how this feels.  Pt discussed ways to get back in balance and short term goals they can work on to get where they want to be. Stayed the entire time, engaged throughout.  "I feel emotionally balanced today.  I'm in much better shape than when I first came in.  I'm not hearing voices, and I am thinking clearly, which helps my emotions a lot."  Admitted he struggles with apologizing to others, even it is owed.  "I guess don't like to admit I am wrong-probably to myself more than anyone else."  Identified nature, specifically running water, as something that helps him find balance.  Was supportive and gave positive feedback to others.   Roque Lias B 02/19/2017 4:10 PM

## 2017-02-20 LAB — HEMOGLOBIN A1C
HEMOGLOBIN A1C: 5.7 % — AB (ref 4.8–5.6)
MEAN PLASMA GLUCOSE: 117 mg/dL

## 2017-02-20 LAB — PROLACTIN: PROLACTIN: 34.9 ng/mL — AB (ref 4.0–15.2)

## 2017-02-20 NOTE — Progress Notes (Signed)
Nursing Progress Note [id number]  D) Patient presents pleasant, calm and cooperative on the unit. Patient has brightened affect and states "I sung at [place] today. In my house we do karaoke twice a week". Patient denies SI/HI/AVH or pain. Patient contracts for safety on the unit. Patient reports sleeping well with current regimen. Patient reports "I feel a lot better, I really want to go home".  A)  Emotional support given. 1:1 interaction and active listening provided. Patient medicated as prescribed. Medications and plan of care reviewed with patient. Patient verbalized understanding without further questions.  Snacks and fluids provided. Opportunities for questions or concerns presented to patient. Patient encouraged to continue to work on treatment goals. Labs, vital signs and patient behavior monitored throughout shift. Patient safety maintained with q15 min safety checks. Low fall risk precautions in place and reviewed with patient; patient verbalized understanding.  R) Patient receptive to interaction with nurse. Patient remains safe on the unit at this time. Patient denies any adverse medication reactions at this time. Patient is resting in bed without complaints. Will continue to monitor.

## 2017-02-20 NOTE — Plan of Care (Signed)
Problem: Safety: Goal: Periods of time without injury will increase Outcome: Progressing Patient is free from injury.  Routine safety maintained every fifteen minutes.

## 2017-02-20 NOTE — Progress Notes (Signed)
Patient attended karaoke group tonight.  

## 2017-02-20 NOTE — Progress Notes (Signed)
DAR NOTE: Patient presents with anxious affect and mood.  Denies auditory and visual hallucinations.  Described energy level as normal and concentration as good.  Rates depression at 3, hopelessness at 0, and anxiety at 3.  Maintained on routine safety checks.  Medications given as prescribed.  Support and encouragement offered as needed.  Attended group and participated.  States goal for today is "staying happy and clear from hearing voices."  Patient is visible in milieu for therapy and activities.  Observed socializing with peers in the dayroom.  Zyprexa 5 mg given for complain of severe anxiety with good effect.

## 2017-02-20 NOTE — BHH Group Notes (Signed)
[facility] Group Therapy  02/20/2017 , 1:28 PM    Type of Therapy:  Mental Health Association Presentation  Participation Level:  Active  Participation Quality:  Attentive  Affect:  Blunted  Cognitive:  Oriented  Insight:  Limited  Engagement in Therapy:  Engaged  Modes of Intervention:  Discussion, Education and Socialization  Summary of Progress/Problems:  Xavier Howell from [place] came to present his recovery story and play the guitar.  Stayed the entire time, engaged throughout.  Shared his story of mental illness and recovery with the presenter.  Xavier Howell 02/20/2017 , 1:28 PM

## 2017-02-20 NOTE — Plan of Care (Signed)
Problem: Activity: Goal: Interest or engagement in activities will improve Outcome: Progressing Patient attended karaoke this evening; patient was engaged and participated by singing a song.

## 2017-02-20 NOTE — Progress Notes (Signed)
[facility] MD Progress Note  02/20/2017 5:41 PM Julia Kulzer  MRN:  [id number] Subjective:  Patient reports he is feeling better, less depressed, and as he improves he is focusing more on being discharged soon. At this time does not endorse medication side effects. Denies any suicidal ideations.  Objective: I have discussed case with treatment team and have met with patient. Patient is presenting with improvement compared to admission- states he is feeling better, and presents with a fuller range of affect. At this time denies any suicidal ideations. He has been going to groups and participating in milieu. No disruptive or agitated behaviors on unit. Denies medication side effects- currently on Prozac and Zyprexa . Staff reports  indicate that patient has continued to present with depression, constricted affect, but that he has been improving and today is exhibiting fuller range of affect.     Principal Problem: Severe recurrent major depressive disorder with psychotic features ([place]) Diagnosis:   Patient Active Problem List   Diagnosis Date Noted  . PTSD (post-traumatic stress disorder) [F43.10] 02/19/2017  . Cannabis use disorder, moderate, dependence ([place]) [F12.20] 02/19/2017  . Severe recurrent major depressive disorder with psychotic features ([place]) [F33.3] 02/18/2017  . Pneumothorax on left [J93.9] 03/02/2016   Total Time spent with patient: 20 minutes  Past Psychiatric History: Please see H&P.   Past Medical History: Please see H&P.  Family History:  Family History  Problem Relation Age of Onset  . Alcoholism Mother   . Cirrhosis Mother   . Cancer Sister   . Cirrhosis Brother   . Lupus Other    Family Psychiatric  History: Please see H&P.  Social History:  History  Alcohol Use No    Comment: socially     History  Drug Use No    Social History   Social History  . Marital status: Single    Spouse name: N/A  . Number of children: N/A  . Years of education: N/A    Social History Main Topics  . Smoking status: Current Every Day Smoker    Packs/day: 0.50  . Smokeless tobacco: Never Used  . Alcohol use No     Comment: socially  . Drug use: No  . Sexual activity: Not Asked   Other Topics Concern  . None   Social History Narrative  . None   Additional Social History:    Pain Medications: see MAR Prescriptions: see MAR Over the Counter: see MAR History of alcohol / drug use?: Yes Negative Consequences of Use: Personal relationships Name of Substance 1: alcohol 1 - Last Use / Amount: occasional Name of Substance 2: Cannabis 2 - Amount (size/oz): 7-8 blunts  2 - Frequency: daily  Sleep: Fair- improving   Appetite:  Good  Current Medications: Current Facility-Administered Medications  Medication Dose Route Frequency Provider Last Rate Last Dose  . acetaminophen (TYLENOL) tablet 650 mg  650 mg Oral Q6H PRN Derrill Center, NP      . alum & mag hydroxide-simeth (MAALOX/MYLANTA) 200-200-20 MG/5ML suspension 30 mL  30 mL Oral Q4H PRN Derrill Center, NP      . feeding supplement (ENSURE ENLIVE) (ENSURE ENLIVE) liquid 237 mL  237 mL Oral BID BM Arrionna Serena, Myer Peer, MD   237 mL at 02/20/17 1401  . FLUoxetine (PROZAC) capsule 20 mg  20 mg Oral Daily Eappen, Saramma, MD   20 mg at 02/20/17 0831  . hydrOXYzine (ATARAX/VISTARIL) tablet 25 mg  25 mg Oral TID PRN Derrill Center, NP  25 mg at 02/19/17 2238  . magnesium hydroxide (MILK OF MAGNESIA) suspension 30 mL  30 mL Oral Daily PRN Derrill Center, NP      . multivitamin with minerals tablet 1 tablet  1 tablet Oral Daily Kyson Kupper, Myer Peer, MD   1 tablet at 02/20/17 0831  . nicotine (NICODERM CQ - dosed in mg/24 hours) patch 21 mg  21 mg Transdermal Daily Eappen, Ria Clock, MD   21 mg at 02/20/17 0831  . OLANZapine (ZYPREXA) tablet 5 mg  5 mg Oral BID PRN Ursula Alert, MD   5 mg at 02/20/17 1402   Or  . OLANZapine (ZYPREXA) injection 5 mg  5 mg Intramuscular BID PRN Eappen, Ria Clock, MD      .  OLANZapine (ZYPREXA) tablet 7.5 mg  7.5 mg Oral QHS Eappen, Saramma, MD   7.5 mg at 02/19/17 2238  . traZODone (DESYREL) tablet 50 mg  50 mg Oral QHS PRN Chaselyn Nanney, Myer Peer, MD   50 mg at 02/19/17 2238    Lab Results:  Results for orders placed or performed during the hospital encounter of 02/17/17 (from the past 48 hour(s))  CBC     Status: None   Collection Time: 02/18/17  6:10 PM  Result Value Ref Range   WBC 5.8 4.0 - 10.5 K/uL   RBC 4.52 4.22 - 5.81 MIL/uL   Hemoglobin 14.7 13.0 - 17.0 g/dL   HCT 42.0 39.0 - 52.0 %   MCV 92.9 78.0 - 100.0 fL   MCH 32.5 26.0 - 34.0 pg   MCHC 35.0 30.0 - 36.0 g/dL   RDW 12.7 11.5 - 15.5 %   Platelets 240 150 - 400 K/uL    Comment: Performed at [facility], [place] [address], [place], [place] [address]  Comprehensive metabolic panel     Status: Abnormal   Collection Time: 02/18/17  6:10 PM  Result Value Ref Range   Sodium 138 135 - 145 mmol/L   Potassium 3.8 3.5 - 5.1 mmol/L   Chloride 102 101 - 111 mmol/L   CO2 28 22 - 32 mmol/L   Glucose, Bld 127 (H) 65 - 99 mg/dL   BUN 15 6 - 20 mg/dL   Creatinine, Ser 1.07 0.61 - 1.24 mg/dL   Calcium 9.4 8.9 - 10.3 mg/dL   Total Protein 7.7 6.5 - 8.1 g/dL   Albumin 4.7 3.5 - 5.0 g/dL   AST 18 15 - 41 U/L   ALT 13 (L) 17 - 63 U/L   Alkaline Phosphatase 61 38 - 126 U/L   Total Bilirubin 0.3 0.3 - 1.2 mg/dL   GFR calc non Af Amer >60 >60 mL/min   GFR calc Af Amer >60 >60 mL/min    Comment: (NOTE) The eGFR has been calculated using the CKD EPI equation. This calculation has not been validated in all clinical situations. eGFR's persistently <60 mL/min signify possible Chronic Kidney Disease.    Anion gap 8 5 - 15    Comment: Performed at [facility], [place] [address], [place], [place] [address]  Hemoglobin A1c     Status: Abnormal   Collection Time: 02/18/17  6:10 PM  Result Value Ref Range   Hgb A1c MFr Bld 5.8 (H) 4.8 - 5.6 %    Comment: (NOTE)          Pre-diabetes: 5.7 - 6.4         Diabetes: >6.4         Glycemic control  for adults with diabetes: <7.0    Mean Plasma Glucose 120 mg/dL    Comment: (NOTE) Performed At: [facility] [place], [place] [id number] Lindon Romp MD [id number] Performed at [facility], [place] [address], [place], [place] [address]   Ethanol     Status: None   Collection Time: 02/18/17  6:10 PM  Result Value Ref Range   Alcohol, Ethyl (B) <5 <5 mg/dL    Comment:        LOWEST DETECTABLE LIMIT FOR SERUM ALCOHOL IS 5 mg/dL FOR MEDICAL PURPOSES ONLY Performed at [place] [address], [place], [place] [address]   TSH     Status: None   Collection Time: 02/18/17  6:10 PM  Result Value Ref Range   TSH 1.221 0.350 - 4.500 uIU/mL    Comment: Performed by a 3rd Generation assay with a functional sensitivity of <=0.01 uIU/mL. Performed at [facility], [place] [address], [place], [place] [address]   Prolactin     Status: None   Collection Time: 02/18/17  6:10 PM  Result Value Ref Range   Prolactin 12.6 4.0 - 15.2 ng/mL    Comment: (NOTE) Performed At: [facility] [place], [place] [id number] Lindon Romp MD [id number] Performed at [facility], [place] [address], [place], [place] [address]   Hemoglobin A1c     Status: Abnormal   Collection Time: 02/19/17  6:18 AM  Result Value Ref Range   Hgb A1c MFr Bld 5.7 (H) 4.8 - 5.6 %    Comment: (NOTE)         Pre-diabetes: 5.7 - 6.4         Diabetes: >6.4         Glycemic control for adults with diabetes: <7.0    Mean Plasma Glucose 117 mg/dL    Comment: (NOTE) Performed At: [facility] [place], [place] [id number] Lindon Romp MD [id number] Performed at [facility], [place] [address] [place], [place] [address]   Prolactin     Status: Abnormal   Collection Time: 02/19/17   6:18 AM  Result Value Ref Range   Prolactin 34.9 (H) 4.0 - 15.2 ng/mL    Comment: (NOTE) Performed At: [facility] [place], [place] [id number] Lindon Romp MD [id number] Performed at [facility], [place] [address], [place], [place] [address]   Lipid panel     Status: Abnormal   Collection Time: 02/19/17  6:18 AM  Result Value Ref Range   Cholesterol 177 0 - 200 mg/dL   Triglycerides 65 <150 mg/dL   HDL 64 >40 mg/dL   Total CHOL/HDL Ratio 2.8 RATIO   VLDL 13 0 - 40 mg/dL   LDL Cholesterol 100 (H) 0 - 99 mg/dL    Comment:        Total Cholesterol/HDL:CHD Risk Coronary Heart Disease Risk Table                     Men   Women  1/2 Average Risk   3.4   3.3  Average Risk       5.0   4.4  2 X Average Risk   9.6   7.1  3 X Average Risk  23.4   11.0        Use the calculated Patient Ratio above and the CHD Risk Table to determine the patient's  CHD Risk.        ATP III CLASSIFICATION (LDL):  <100     mg/dL   Optimal  100-129  mg/dL   Near or Above                    Optimal  130-159  mg/dL   Borderline  160-189  mg/dL   High  >190     mg/dL   Very High Performed at [place] [address] [place], [place] [address]     Blood Alcohol level:  Lab Results  Component Value Date   [facility] <5 02/18/2017   ETH <5 68/11/2120    Metabolic Disorder Labs: Lab Results  Component Value Date   HGBA1C 5.7 (H) 02/19/2017   MPG 117 02/19/2017   MPG 120 02/18/2017   Lab Results  Component Value Date   PROLACTIN 34.9 (H) 02/19/2017   PROLACTIN 12.6 02/18/2017   Lab Results  Component Value Date   CHOL 177 02/19/2017   TRIG 65 02/19/2017   HDL 64 02/19/2017   CHOLHDL 2.8 02/19/2017   VLDL 13 02/19/2017   LDLCALC 100 (H) 02/19/2017    Physical Findings: AIMS: Facial and Oral Movements Muscles of Facial Expression: None, normal Lips and Perioral Area: None, normal Jaw: None, normal Tongue: None, normal,Extremity  Movements Upper (arms, wrists, hands, fingers): None, normal Lower (legs, knees, ankles, toes): None, normal, Trunk Movements Neck, shoulders, hips: None, normal, Overall Severity Severity of abnormal movements (highest score from questions above): None, normal Incapacitation due to abnormal movements: None, normal Patient's awareness of abnormal movements (rate only patient's report): No Awareness, Dental Status Current problems with teeth and/or dentures?: No Does patient usually wear dentures?: No  CIWA:    COWS:     Musculoskeletal: Strength & Muscle Tone: within normal limits Gait & Station: normal Patient leans: N/A  Psychiatric Specialty Exam: Physical Exam  Review of Systems  Psychiatric/Behavioral: Positive for depression and substance abuse. The patient is nervous/anxious.   All other systems reviewed and are negative. denies headache, denies chest pain , no shortness of breath, no vomiting   Blood pressure 134/90, pulse 72, temperature 98.2 F (36.8 C), temperature source Oral, resp. rate 16, height '5\' 11"'  (1.803 m), weight 58.5 kg (129 lb), SpO2 98 %.Body mass index is 17.99 kg/m.  General Appearance: improved grooming   Eye Contact:  Good  Speech:  Normal Rate  Volume:  Normal  Mood:  improving , less depressed   Affect:  more reactive, less constricted   Thought Process:  Linear and Descriptions of Associations: Intact  Orientation:  Other:  fully alert and attentive  Thought Content:  at this time denies hallucinations, no delusions expressed   Suicidal Thoughts:  No denies any suicidal or self injurious ideations at this time, and denies any homicidal or violent ideations  Homicidal Thoughts:  No  Memory:  Recent and remote grossly intact   Judgement:  Other:  improving   Insight:  improving   Psychomotor Activity:  Normal  Concentration:  Concentration: Good and Attention Span: Good  Recall:  Good  Fund of Knowledge:  Good  Language:  Good  Akathisia:   Negative  Handed:  Right  AIMS (if indicated):     Assets:  Communication Skills Desire for Improvement  ADL's:  Intact  Cognition:  WNL  Sleep:  Number of Hours: 5.45   Assessment - 31 year old male, who presented due to worsening depression, suicidal ideations,  and critical auditory hallucinations. At this time presents improved, states he is feeling better, is future oriented, and states hallucinations have decreased Xavier Howell. Tolerating medications well , denies side effects. Focused on being discharged soon.   Treatment Plan :  Treatment plan reviewed as below today 6/7   Daily contact with patient to assess and evaluate symptoms and progress in treatment, Medication management and Plan see below  Continue  Prozac  20 mg po daily for affective sx. Continue Zyprexa  7.5 mg po qhs for mood disorder and psychosis  Continue Vistaril 25 mgrs Q 8 hours PRN for anxiety Continue Trazodone 50 mgrs QHS PRN for insomnia Treatment team working on disposition planning    Jenne Campus, MD 02/20/2017, 5:41 PM   Patient ID: Reford Olliff, male   DOB: 11-03-1985, 31 y.o.   MRN: [id number]

## 2017-02-21 MED ORDER — TRAZODONE HCL 50 MG PO TABS: ORAL_TABLET | ORAL | 0 refills | Status: AC

## 2017-02-21 MED ORDER — NICOTINE 21 MG/24HR TD PT24: 21.0000 mg | MEDICATED_PATCH | Freq: Every day | TRANSDERMAL | 0 refills | Status: AC

## 2017-02-21 MED ORDER — FLUOXETINE HCL 20 MG PO CAPS: 20.0000 mg | ORAL_CAPSULE | Freq: Every day | ORAL | 0 refills | Status: AC

## 2017-02-21 MED ORDER — OLANZAPINE 7.5 MG PO TABS: 7.5000 mg | ORAL_TABLET | Freq: Every day | ORAL | 0 refills | Status: AC

## 2017-02-21 MED ORDER — HYDROXYZINE HCL 25 MG PO TABS: 25.0000 mg | ORAL_TABLET | Freq: Three times a day (TID) | ORAL | 0 refills | Status: DC | PRN

## 2017-02-21 NOTE — Tx Team (Signed)
Interdisciplinary Treatment and Diagnostic Plan Update  02/21/2017 Time of Session: 3:00 PM  Xavier Howell MRN: [id number]  Principal Diagnosis: Severe recurrent major depressive disorder with psychotic features [facility])  Secondary Diagnoses: Principal Problem:   Severe recurrent major depressive disorder with psychotic features ([place]) Active Problems:   PTSD (post-traumatic stress disorder)   Cannabis use disorder, moderate, dependence ([place])   Current Medications:  Current Facility-Administered Medications  Medication Dose Route Frequency Provider Last Rate Last Dose  . acetaminophen (TYLENOL) tablet 650 mg  650 mg Oral Q6H PRN Derrill Center, NP      . alum & mag hydroxide-simeth (MAALOX/MYLANTA) 200-200-20 MG/5ML suspension 30 mL  30 mL Oral Q4H PRN Derrill Center, NP      . feeding supplement (ENSURE ENLIVE) (ENSURE ENLIVE) liquid 237 mL  237 mL Oral BID BM Cobos, Myer Peer, MD   237 mL at 02/21/17 1156  . FLUoxetine (PROZAC) capsule 20 mg  20 mg Oral Daily Eappen, Saramma, MD   20 mg at 02/21/17 0734  . hydrOXYzine (ATARAX/VISTARIL) tablet 25 mg  25 mg Oral TID PRN Derrill Center, NP   25 mg at 02/19/17 2238  . magnesium hydroxide (MILK OF MAGNESIA) suspension 30 mL  30 mL Oral Daily PRN Derrill Center, NP      . multivitamin with minerals tablet 1 tablet  1 tablet Oral Daily Cobos, Myer Peer, MD   1 tablet at 02/21/17 0734  . nicotine (NICODERM CQ - dosed in mg/24 hours) patch 21 mg  21 mg Transdermal Daily Eappen, Ria Clock, MD   21 mg at 02/21/17 0735  . OLANZapine (ZYPREXA) tablet 5 mg  5 mg Oral BID PRN Ursula Alert, MD   5 mg at 02/20/17 1402   Or  . OLANZapine (ZYPREXA) injection 5 mg  5 mg Intramuscular BID PRN Eappen, Ria Clock, MD      . OLANZapine (ZYPREXA) tablet 7.5 mg  7.5 mg Oral QHS Eappen, Saramma, MD   7.5 mg at 02/20/17 2217  . traZODone (DESYREL) tablet 50 mg  50 mg Oral QHS PRN Cobos, Myer Peer, MD   50 mg at 02/20/17 2217   Current Outpatient  Prescriptions  Medication Sig Dispense Refill  . acetaminophen (TYLENOL) 325 MG tablet Take 2 tablets (650 mg total) by mouth every 6 (six) hours as needed for moderate pain.    Derrill Memo ON 02/22/2017] FLUoxetine (PROZAC) 20 MG capsule Take 1 capsule (20 mg total) by mouth daily. For depression 30 capsule 0  . hydrOXYzine (ATARAX/VISTARIL) 25 MG tablet Take 1 tablet (25 mg total) by mouth 3 (three) times daily as needed for anxiety. 60 tablet 0  . nicotine (NICODERM CQ - DOSED IN MG/24 HOURS) 21 mg/24hr patch Place 1 patch (21 mg total) onto the skin daily. For smoking cessation 28 patch 0  . OLANZapine (ZYPREXA) 7.5 MG tablet Take 1 tablet (7.5 mg total) by mouth at bedtime. For mood control 30 tablet 0  . traZODone (DESYREL) 50 MG tablet Take 1 tablet (50 mg) Q bedtime: For sleep 30 tablet 0    PTA Medications: No prescriptions prior to admission.    Treatment Modalities: Medication Management, Group therapy, Case management,  1 to 1 session with clinician, Psychoeducation, Recreational therapy.   Physician Treatment Plan for Primary Diagnosis: Severe recurrent major depressive disorder with psychotic features ([place]) Long Term Goal(s): Improvement in symptoms so as ready for discharge  Short Term Goals: Ability to identify changes in lifestyle to reduce recurrence of  condition will improve Ability to verbalize feelings will improve Ability to disclose and discuss suicidal ideas Ability to demonstrate self-control will improve Ability to identify and develop effective coping behaviors will improve Compliance with prescribed medications will improve Ability to identify triggers associated with substance abuse/mental health issues will improve  Medication Management: Evaluate patient's response, side effects, and tolerance of medication regimen.  Therapeutic Interventions: 1 to 1 sessions, Unit Group sessions and Medication administration.  Evaluation of Outcomes: Adequate for  Discharge  Physician Treatment Plan for Secondary Diagnosis: Principal Problem:   Severe recurrent major depressive disorder with psychotic features ([place]) Active Problems:   PTSD (post-traumatic stress disorder)   Cannabis use disorder, moderate, dependence ([place])   Long Term Goal(s): Improvement in symptoms so as ready for discharge  Short Term Goals: Ability to identify changes in lifestyle to reduce recurrence of condition will improve Ability to verbalize feelings will improve Ability to disclose and discuss suicidal ideas Ability to demonstrate self-control will improve Ability to identify and develop effective coping behaviors will improve Compliance with prescribed medications will improve Ability to identify triggers associated with substance abuse/mental health issues will improve  Medication Management: Evaluate patient's response, side effects, and tolerance of medication regimen.  Therapeutic Interventions: 1 to 1 sessions, Unit Group sessions and Medication administration.  Evaluation of Outcomes: Adequate for Discharge   RN Treatment Plan for Primary Diagnosis: Severe recurrent major depressive disorder with psychotic features ([place]) Long Term Goal(s): Knowledge of disease and therapeutic regimen to maintain health will improve  Short Term Goals: Ability to identify and develop effective coping behaviors will improve and Compliance with prescribed medications will improve  Medication Management: RN will administer medications as ordered by provider, will assess and evaluate patient's response and provide education to patient for prescribed medication. RN will report any adverse and/or side effects to prescribing provider.  Therapeutic Interventions: 1 on 1 counseling sessions, Psychoeducation, Medication administration, Evaluate responses to treatment, Monitor vital signs and CBGs as ordered, Perform/monitor CIWA, COWS, AIMS and Fall Risk screenings as ordered, Perform  wound care treatments as ordered.  Evaluation of Outcomes: Adequate for Discharge   LCSW Treatment Plan for Primary Diagnosis: Severe recurrent major depressive disorder with psychotic features ([place]) Long Term Goal(s): Safe transition to appropriate next level of care at discharge, Engage patient in therapeutic group addressing interpersonal concerns.  Short Term Goals: Engage patient in aftercare planning with referrals and resources  Therapeutic Interventions: Assess for all discharge needs, 1 to 1 time with Social worker, Explore available resources and support systems, Assess for adequacy in community support network, Educate family and significant other(s) on suicide prevention, Complete Psychosocial Assessment, Interpersonal group therapy.  Evaluation of Outcomes: Met  Return home, follow up outpt   Progress in Treatment: Attending groups: Yes Participating in groups: Yes Taking medication as prescribed: Yes Toleration medication: Yes, no side effects reported at this time Family/Significant other contact made: No Patient understands diagnosis: Yes AEB asking for help with voices Discussing patient identified problems/goals with staff: Yes Medical problems stabilized or resolved: Yes Denies suicidal/homicidal ideation: Yes Issues/concerns per patient self-inventory: None Other: N/A  New problem(s) identified: None identified at this time.   New Short Term/Long Term Goal(s): None identified at this time.   Discharge Plan or Barriers:   Reason for Continuation of Hospitalization:  Estimated Length of Stay: D/C today  Attendees: Patient: 02/21/2017  3:00 PM  Physician: Ursula Alert, MD 02/21/2017  3:00 PM  Nursing: Darrol Angel, RN 02/21/2017  3:00 PM  RN Care Manager: Lars Pinks, RN 02/21/2017  3:00 PM  Social Worker: Ripley Fraise 02/21/2017  3:00 PM  Recreational Therapist: Marjette  02/21/2017  3:00 PM  Other: Norberto Sorenson 02/21/2017  3:00 PM  Other:  02/21/2017  3:00 PM     Scribe for Treatment Team:  Roque Lias LCSW 02/21/2017 3:00 PM
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
## 2017-02-21 NOTE — Progress Notes (Signed)
Recreation Therapy Notes  Date: 02/21/17 Time: 1000 Location: 500 Hall Dayroom  Group Topic: Communication, Team Building, Problem Solving  Goal Area(s) Addresses:  Patient will effectively work with peer towards shared goal.  Patient will identify skill used to make activity successful.  Patient will identify how skills used during activity can be used to reach post d/c goals.   Intervention: STEM Activity   Activity: [employer]. Patients were provided the following materials: 5 drinking straws, 5 rubber bands, 5 paper clips, 2 index cards, 2 drinking cups, and 2 toilet paper rolls. Using the provided materials patients were asked to build a launching mechanisms to launch a ping pong ball approximately 12 feet. Patients were divided into teams of 3-5.   Education: Education officer, community, Dentist.   Education Outcome: Acknowledges education/In group clarification offered/Needs additional education.   Clinical Observations/Feedback: Pt did not attend group.   Victorino Sparrow, LRT/CTRS         Ria Comment, Aisling Emigh A 02/21/2017 11:55 AM

## 2017-02-21 NOTE — Plan of Care (Signed)
Problem: [facility] Participation in Recreation Therapeutic Interventions Goal: STG-Patient will identify at least five coping skills for ** STG: Coping Skills - Patient will be able to identify at least 5 coping skills for depression by conclusion of recreation therapy tx  Outcome: Completed/Met Date Met: 02/21/17 Pt was able to identify coping skills at completion of coping skills recreation therapy session.  Victorino Sparrow, LRT/CTRS

## 2017-02-21 NOTE — Progress Notes (Signed)
Pt discharged to lobby. Pt was stable and appreciative at that time. All papers, samples and prescriptions were given and valuables returned. Verbal understanding expressed. Denies SI/HI and A/VH. Pt given opportunity to express concerns and ask questions.  

## 2017-02-21 NOTE — BHH Suicide Risk Assessment (Addendum)
[facility] Discharge Suicide Risk Assessment   Principal Problem: Severe recurrent major depressive disorder with psychotic features [facility]) Discharge Diagnoses:  Patient Active Problem List   Diagnosis Date Noted  . PTSD (post-traumatic stress disorder) [F43.10] 02/19/2017  . Cannabis use disorder, moderate, dependence ([place]) [F12.20] 02/19/2017  . Severe recurrent major depressive disorder with psychotic features ([place]) [F33.3] 02/18/2017  . Pneumothorax on left [J93.9] 03/02/2016    Total Time spent with patient: 30 minutes  Musculoskeletal: Strength & Muscle Tone: within normal limits Gait & Station: normal Patient leans: N/A  Psychiatric Specialty Exam: ROS denies headache, denies chest pain, denies shortness of breath, no vomiting  Blood pressure (!) 144/84, pulse 75, temperature 98.3 F (36.8 C), temperature source Oral, resp. rate 16, height 5\' 11"  (1.803 m), weight 58.5 kg (129 lb), SpO2 98 %.Body mass index is 17.99 kg/m.  General Appearance: improved grooming   Eye Contact::  Good  Speech:  Normal Rate409  Volume:  Normal  Mood:  improved mood, states he feels "OK" today, presents euthymic  Affect:  Appropriate and Full Range  Thought Process:  Linear and Descriptions of Associations: Intact  Orientation:  Other:  fully alert and attentive   Thought Content:  denies hallucinations, no delusions, not internally preoccupied  Suicidal Thoughts:  No denies suicidal or self injurious ideations, denies any homicidal or violent ideations  Homicidal Thoughts:  No  Memory:  recent and remote grossly intact   Judgement:  Other:  improving   Insight:  improving   Psychomotor Activity:  Normal  Concentration:  Good  Recall:  Good  Fund of Knowledge:Good  Language: Good  Akathisia:  Negative  Handed:  Right  AIMS (if indicated):   no abnormal or involuntary movements noted or reported  Assets:  Communication Skills Desire for Improvement Resilience  Sleep:  Number of Hours: 6.5   Cognition: WNL  ADL's:  Intact   Mental Status Per Nursing Assessment::   On Admission:  Self-harm thoughts  Demographic Factors:  31 year old male , lives with GF and GF's mother, has three children, employed   Loss Factors: Loss of loved ones   Historical Factors: History of depression, history of hallucinations, history of angry outbursts in the past   Risk Reduction Factors:   Responsible for children under 32 years of age, Sense of responsibility to family, Living with another person, especially a relative and Positive coping skills or problem solving skills  Continued Clinical Symptoms:  At this time patient is alert, attentive, well groomed, good eye contact, speech normal, mood is improved and currently euthymic, affect is appropriate,full in range , no thought disorder, no suicidal or self injurious ideations, no homicidal ideations , denies hallucinations and does not appear internally preoccupied, future oriented. Denies medication side effects. We have reviewed side effects, including risk of sedation, metabolic disturbances, weight gain, movement disorders . Behavior on unit calm and in good control, pleasant on approach. No disruptive or explosive angry episodes noted during admission  Cognitive Features That Contribute To Risk:  No gross cognitive deficits noted upon discharge. Is alert , attentive, and oriented x 3  Suicide Risk:  Mild:  Suicidal ideation of limited frequency, intensity, duration, and specificity.  There are no identifiable plans, no associated intent, mild dysphoria and related symptoms, good self-control (both objective and subjective assessment), few other risk factors, and identifiable protective factors, including available and accessible social support.  Follow-up Information    [facility] Follow up on 02/24/2017.   Specialty:  Behavioral Health Why:   Monday at 8:30AM  for your hospital follow up appointment Please bring along picture ID, hospital  d/c paperwork and proof of income, if any Contact information: [place] [address] [phone]        [facility] Follow up on 02/26/2017.   Specialty:  Behavioral Health Why:  at 2:00pm with Waunita Schooner for therapy. Please arrive 20 minutes early to complete new patient paperwork.  Contact information: [address], [place] [address] [phone]           Plan Of Care/Follow-up recommendations:  Activity:  as tolerated  Diet:  regular Tests:  NA Other:  See below  Patient is requesting discharge and there are no current grounds for involuntary commitment  He is leaving unit in good spirits Plans to return home Plans to follow up as above   Jenne Campus, MD 02/21/2017, 12:31 PM

## 2017-02-21 NOTE — Progress Notes (Signed)
  [facility] Adult Case Management Discharge Plan :  Will you be returning to the same living situation after discharge:  Yes,  home At discharge, do you have transportation home?: Yes,  family Do you have the ability to pay for your medications: Yes,  mental health  Release of information consent forms completed and in the chart;  Patient's signature needed at discharge.  Patient to Follow up at: Follow-up Information    [facility] Follow up on 02/24/2017.   Specialty:  Behavioral Health Why:   Monday at 8:30AM  for your hospital follow up appointment Please bring along picture ID, hospital d/c paperwork and proof of income, if any Contact information: [place] [address] [phone]        [facility] [place] [facility] Follow up on 02/26/2017.   Specialty:  Behavioral Health Why:  at 2:00pm with Waunita Schooner for therapy. Please arrive 20 minutes early to complete new patient paperwork.  Contact information: [address], [place] [address] [phone]           Next level of care provider has access to [place] and Suicide Prevention discussed: Yes,  yes  Have you used any form of tobacco in the last 30 days? (Cigarettes, Smokeless Tobacco, Cigars, and/or Pipes): Yes  Has patient been referred to the Quitline?: Patient refused referral  Patient has been referred for addiction treatment: Yes  Trish Mage 02/21/2017, 3:02 PM

## 2019-01-11 ENCOUNTER — Inpatient Hospital Stay (HOSPITAL_COMMUNITY): Payer: 59

## 2019-01-11 ENCOUNTER — Encounter (HOSPITAL_COMMUNITY): Payer: Self-pay | Admitting: General Practice

## 2019-01-11 ENCOUNTER — Other Ambulatory Visit: Payer: Self-pay

## 2019-01-11 ENCOUNTER — Emergency Department (HOSPITAL_COMMUNITY): Payer: 59

## 2019-01-11 ENCOUNTER — Inpatient Hospital Stay (HOSPITAL_COMMUNITY)
Admission: EM | Admit: 2019-01-11 | Discharge: 2019-01-16 | DRG: 165 | Disposition: A | Discharge status: Home / Self Care | Payer: 59 | Attending: Thoracic Surgery (Cardiothoracic Vascular Surgery) | Admitting: Thoracic Surgery (Cardiothoracic Vascular Surgery)

## 2019-01-11 DIAGNOSIS — F329 Major depressive disorder, single episode, unspecified: Secondary | ICD-10-CM | POA: Diagnosis present

## 2019-01-11 DIAGNOSIS — J9311 Primary spontaneous pneumothorax: Secondary | ICD-10-CM | POA: Diagnosis not present

## 2019-01-11 DIAGNOSIS — F4310 Post-traumatic stress disorder, unspecified: Secondary | ICD-10-CM | POA: Diagnosis present

## 2019-01-11 DIAGNOSIS — J9383 Other pneumothorax: Principal | ICD-10-CM | POA: Diagnosis present

## 2019-01-11 DIAGNOSIS — Z811 Family history of alcohol abuse and dependence: Secondary | ICD-10-CM | POA: Diagnosis not present

## 2019-01-11 DIAGNOSIS — F17210 Nicotine dependence, cigarettes, uncomplicated: Secondary | ICD-10-CM | POA: Diagnosis present

## 2019-01-11 DIAGNOSIS — Z4682 Encounter for fitting and adjustment of non-vascular catheter: Secondary | ICD-10-CM

## 2019-01-11 DIAGNOSIS — J939 Pneumothorax, unspecified: Secondary | ICD-10-CM

## 2019-01-11 DIAGNOSIS — Z809 Family history of malignant neoplasm, unspecified: Secondary | ICD-10-CM | POA: Diagnosis not present

## 2019-01-11 DIAGNOSIS — J9382 Other air leak: Secondary | ICD-10-CM | POA: Diagnosis present

## 2019-01-11 HISTORY — DX: Acquired absence of kidney: Z90.5

## 2019-01-11 HISTORY — PX: CHEST TUBE INSERTION: SHX231

## 2019-01-11 LAB — COMPREHENSIVE METABOLIC PANEL
ALT: 14 U/L (ref 0–44)
AST: 22 U/L (ref 15–41)
Albumin: 4 g/dL (ref 3.5–5.0)
Alkaline Phosphatase: 49 U/L (ref 38–126)
Anion gap: 8 (ref 5–15)
BUN: 12 mg/dL (ref 6–20)
CO2: 26 mmol/L (ref 22–32)
Calcium: 8.9 mg/dL (ref 8.9–10.3)
Chloride: 105 mmol/L (ref 98–111)
Creatinine, Ser: 1.21 mg/dL (ref 0.61–1.24)
GFR calc Af Amer: >60 mL/min (ref >60)
GFR calc non Af Amer: >60 mL/min (ref >60)
Glucose, Bld: 164 mg/dL — ABNORMAL HIGH (ref 70–99)
Potassium: 3.2 mmol/L — ABNORMAL LOW (ref 3.5–5.1)
Sodium: 139 mmol/L (ref 135–145)
Total Bilirubin: 0.6 mg/dL (ref 0.3–1.2)
Total Protein: 6.6 g/dL (ref 6.5–8.1)

## 2019-01-11 LAB — CBC WITH DIFFERENTIAL/PLATELET
Abs Immature Granulocytes: 0.01 10*3/uL (ref 0.00–0.07)
Basophils Absolute: 0.1 10*3/uL (ref 0.0–0.1)
Basophils Relative: 1 %
Eosinophils Absolute: 0.2 10*3/uL (ref 0.0–0.5)
Eosinophils Relative: 2 %
HCT: 41.6 % (ref 39.0–52.0)
Hemoglobin: 13.6 g/dL (ref 13.0–17.0)
Immature Granulocytes: 0 %
Lymphocytes Relative: 60 %
Lymphs Abs: 4.4 10*3/uL — ABNORMAL HIGH (ref 0.7–4.0)
MCH: 32.2 pg (ref 26.0–34.0)
MCHC: 32.7 g/dL (ref 30.0–36.0)
MCV: 98.3 fL (ref 80.0–100.0)
Monocytes Absolute: 0.6 10*3/uL (ref 0.1–1.0)
Monocytes Relative: 8 %
Neutro Abs: 2.1 10*3/uL (ref 1.7–7.7)
Neutrophils Relative %: 29 %
Platelets: 223 10*3/uL (ref 150–400)
RBC: 4.23 MIL/uL (ref 4.22–5.81)
RDW: 12.4 % (ref 11.5–15.5)
WBC: 7.3 10*3/uL (ref 4.0–10.5)
nRBC: 0 % (ref 0.0–0.2)

## 2019-01-11 LAB — PROTIME-INR
INR: 1.1 (ref 0.8–1.2)
Prothrombin Time: 14.2 seconds (ref 11.4–15.2)

## 2019-01-11 MED ORDER — ENOXAPARIN SODIUM 40 MG/0.4ML ~~LOC~~ SOLN
40.0000 mg | SUBCUTANEOUS | Status: DC
  Administered 2019-01-11 – 2019-01-15 (×5): 40 mg via SUBCUTANEOUS
  Filled 2019-01-11 (×5): qty 0.4

## 2019-01-11 MED ORDER — NALOXONE HCL 0.4 MG/ML IJ SOLN: 0.4000 mg | INTRAMUSCULAR | Status: DC | PRN

## 2019-01-11 MED ORDER — HYDROMORPHONE HCL 1 MG/ML IJ SOLN
0.5000 mg | Freq: Once | INTRAMUSCULAR | Status: AC
  Administered 2019-01-11: 0.5 mg via INTRAVENOUS
  Filled 2019-01-11: qty 1

## 2019-01-11 MED ORDER — NICOTINE 21 MG/24HR TD PT24
21.0000 mg | MEDICATED_PATCH | Freq: Every day | TRANSDERMAL | Status: DC
  Administered 2019-01-11 – 2019-01-15 (×5): 21 mg via TRANSDERMAL
  Filled 2019-01-11 (×5): qty 1

## 2019-01-11 MED ORDER — HYDROMORPHONE HCL 1 MG/ML IJ SOLN
1.0000 mg | INTRAMUSCULAR | Status: DC | PRN
  Administered 2019-01-11: 1 mg via INTRAVENOUS
  Filled 2019-01-11: qty 1

## 2019-01-11 MED ORDER — FENTANYL CITRATE (PF) 100 MCG/2ML IJ SOLN
50.0000 ug | Freq: Once | INTRAMUSCULAR | Status: AC
  Administered 2019-01-11: 50 ug via INTRAVENOUS

## 2019-01-11 MED ORDER — LIDOCAINE-EPINEPHRINE (PF) 2 %-1:200000 IJ SOLN
20.0000 mL | Freq: Once | INTRAMUSCULAR | Status: AC
  Administered 2019-01-11: 20 mL via INTRADERMAL
  Filled 2019-01-11: qty 20

## 2019-01-11 MED ORDER — SODIUM CHLORIDE 0.9% FLUSH: 9.0000 mL | INTRAVENOUS | Status: DC | PRN

## 2019-01-11 MED ORDER — HYDROXYZINE HCL 25 MG PO TABS: 25.0000 mg | ORAL_TABLET | Freq: Three times a day (TID) | ORAL | Status: DC | PRN

## 2019-01-11 MED ORDER — ONDANSETRON HCL 4 MG/2ML IJ SOLN
4.0000 mg | Freq: Four times a day (QID) | INTRAMUSCULAR | Status: DC | PRN
  Administered 2019-01-11 – 2019-01-12 (×2): 4 mg via INTRAVENOUS
  Filled 2019-01-11 (×2): qty 2

## 2019-01-11 MED ORDER — DIPHENHYDRAMINE HCL 50 MG/ML IJ SOLN: 12.5000 mg | Freq: Four times a day (QID) | INTRAMUSCULAR | Status: DC | PRN

## 2019-01-11 MED ORDER — ACETAMINOPHEN 500 MG PO TABS: 1000.0000 mg | ORAL_TABLET | Freq: Four times a day (QID) | ORAL | Status: DC | PRN

## 2019-01-11 MED ORDER — HYDROMORPHONE 1 MG/ML IV SOLN
INTRAVENOUS | Status: DC
  Administered 2019-01-11: 1.5 mg via INTRAVENOUS
  Administered 2019-01-11: 2 mg via INTRAVENOUS
  Administered 2019-01-11: 11:00:00 via INTRAVENOUS
  Administered 2019-01-11: 1.2 mg via INTRAVENOUS
  Administered 2019-01-12: 0.9 mg via INTRAVENOUS
  Administered 2019-01-12: 0.3 mg via INTRAVENOUS
  Administered 2019-01-12: 0.9 mg via INTRAVENOUS
  Administered 2019-01-12: 0.6 mg via INTRAVENOUS
  Administered 2019-01-12: 1.2 mg via INTRAVENOUS
  Administered 2019-01-12: 0.6 mg via INTRAVENOUS
  Administered 2019-01-13: 1.2 mg via INTRAVENOUS
  Administered 2019-01-13: 0.9 mg via INTRAVENOUS
  Administered 2019-01-13: 25 mg via INTRAVENOUS
  Administered 2019-01-13: 0.9 mg via INTRAVENOUS
  Administered 2019-01-13: 1.5 mg via INTRAVENOUS
  Administered 2019-01-13: 1.2 mg via INTRAVENOUS
  Administered 2019-01-13 – 2019-01-14 (×2): 0.9 mg via INTRAVENOUS
  Administered 2019-01-14: 0.6 mg via INTRAVENOUS
  Administered 2019-01-14: 0.9 mg via INTRAVENOUS
  Administered 2019-01-14: 0.3 mg via INTRAVENOUS
  Administered 2019-01-14 – 2019-01-15 (×2): 0.6 mg via INTRAVENOUS
  Filled 2019-01-11 (×2): qty 25

## 2019-01-11 MED ORDER — FENTANYL CITRATE (PF) 100 MCG/2ML IJ SOLN
50.0000 ug | Freq: Once | INTRAMUSCULAR | Status: AC
  Administered 2019-01-11: 50 ug via INTRAVENOUS
  Filled 2019-01-11: qty 2

## 2019-01-11 MED ORDER — KETOROLAC TROMETHAMINE 15 MG/ML IJ SOLN
15.0000 mg | Freq: Four times a day (QID) | INTRAMUSCULAR | Status: DC | PRN
  Administered 2019-01-11 (×2): 15 mg via INTRAVENOUS
  Filled 2019-01-11 (×2): qty 1

## 2019-01-11 MED ORDER — DIPHENHYDRAMINE HCL 12.5 MG/5ML PO ELIX: 12.5000 mg | ORAL_SOLUTION | Freq: Four times a day (QID) | ORAL | Status: DC | PRN

## 2019-01-11 NOTE — Progress Notes (Signed)
Patient called out and said that he had an accident. This RN went in and found patient standing. Patient was trying to get to the basin because he was getting ready to vomit. Patient vomited on the floor which was mostly clear. This is the second time patient has thrown up since he was placed on PCA.   Patient was cleaned up, zofran given and toradol for pain. Upper left lung sounds fluid/air. Unchanged from earlier. No change in air leak status.   Advised patient that if he continued to have nausea and vomiting, then might have to look into d/c PCA . Patient stated that, "he didn't get sick until he was placed on PCA".   Will continue to monitor.

## 2019-01-11 NOTE — H&P (Addendum)
Xavier Howell is an 33 y.o. male.   Chief Complaint: shortness of breath HPI: 33 yo man with a history of a left spontaneous pneumothorax in 2017 who presents with sudden onset left sided CP and SOB. Got out of bed around 3 AM. No significant exertion or cough. Bent over and when he stood back up felt the pain and then became SOB. EMS called and brought to ED. CXR showed a large left pneumothorax. CT placed by ED physician with reexpansion of lung. Now has some pain at insertion site but no longer short of breath.  PAST MEDICAL HISTORY Left pneumothorax Severe depression PTSD. Cannabis use   Family History  Problem Relation Age of Onset  . Alcoholism Mother   . Cirrhosis Mother   . Cancer Sister   . Cirrhosis Brother   . Lupus Other    Social History:  reports that he has been smoking. He has been smoking about 0.50 packs per day. He has never used smokeless tobacco. He reports that he does not drink alcohol or use drugs.  Allergies: No Known Allergies  (Not in a hospital admission)   Results for orders placed or performed during the hospital encounter of 01/11/19 (from the past 48 hour(s))  CBC with Differential     Status: Abnormal   Collection Time: 01/11/19  5:28 AM  Result Value Ref Range   WBC 7.3 4.0 - 10.5 K/uL   RBC 4.23 4.22 - 5.81 MIL/uL   Hemoglobin 13.6 13.0 - 17.0 g/dL   HCT 41.6 39.0 - 52.0 %   MCV 98.3 80.0 - 100.0 fL   MCH 32.2 26.0 - 34.0 pg   MCHC 32.7 30.0 - 36.0 g/dL   RDW 12.4 11.5 - 15.5 %   Platelets 223 150 - 400 K/uL   nRBC 0.0 0.0 - 0.2 %   Neutrophils Relative % 29 %   Neutro Abs 2.1 1.7 - 7.7 K/uL   Lymphocytes Relative 60 %   Lymphs Abs 4.4 (H) 0.7 - 4.0 K/uL   Monocytes Relative 8 %   Monocytes Absolute 0.6 0.1 - 1.0 K/uL   Eosinophils Relative 2 %   Eosinophils Absolute 0.2 0.0 - 0.5 K/uL   Basophils Relative 1 %   Basophils Absolute 0.1 0.0 - 0.1 K/uL   Immature Granulocytes 0 %   Abs Immature Granulocytes 0.01 0.00 - 0.07 K/uL     Comment: Performed at [facility], [address] [place], [place] [address]  Comprehensive metabolic panel     Status: Abnormal   Collection Time: 01/11/19  5:28 AM  Result Value Ref Range   Sodium 139 135 - 145 mmol/L   Potassium 3.2 (L) 3.5 - 5.1 mmol/L   Chloride 105 98 - 111 mmol/L   CO2 26 22 - 32 mmol/L   Glucose, Bld 164 (H) 70 - 99 mg/dL   BUN 12 6 - 20 mg/dL   Creatinine, Ser 1.21 0.61 - 1.24 mg/dL   Calcium 8.9 8.9 - 10.3 mg/dL   Total Protein 6.6 6.5 - 8.1 g/dL   Albumin 4.0 3.5 - 5.0 g/dL   AST 22 15 - 41 U/L   ALT 14 0 - 44 U/L   Alkaline Phosphatase 49 38 - 126 U/L   Total Bilirubin 0.6 0.3 - 1.2 mg/dL   GFR calc non Af Amer >60 >60 mL/min   GFR calc Af Amer >60 >60 mL/min   Anion gap 8 5 - 15    Comment: Performed  at [facility], [place] [address], [place], [place] [address]  Protime-INR     Status: None   Collection Time: 01/11/19  5:28 AM  Result Value Ref Range   Prothrombin Time 14.2 11.4 - 15.2 seconds   INR 1.1 0.8 - 1.2    Comment: (NOTE) INR goal varies based on device and disease states. Performed at [facility], [place] [address] [place], [place] [address]    Dg Chest Portable 1 View  Result Date: 01/11/2019 CLINICAL DATA:  Chest tube placement. EXAM: PORTABLE CHEST 1 VIEW COMPARISON:  One-view chest x-ray 01/11/2019 at 5:28 a.m. FINDINGS: Heart size is normal.  Mediastinal shift is corrected. A left-sided chest tube is now in place. The left lung is re-expanded. A small apical pneumothorax remains. Small pleural effusion is suspected. Linear atelectasis is present in the left midlung. Right lung is clear. IMPRESSION: 1. Re-expansion of left lung following chest tube placement with small residual apical pneumothorax. 2. Probable small left pleural effusion. 3. No midline shift 4. Linear atelectasis in the left midlung. Electronically Signed   By: San Morelle M.D.   On: 01/11/2019 07:07   Dg Chest Portable 1 View  Result  Date: 01/11/2019 CLINICAL DATA:  Hervey Ard left-sided chest pain. History of pneumothorax. EXAM: PORTABLE CHEST 1 VIEW COMPARISON:  Two-view chest x-ray 03/25/16 FINDINGS: Heart size is normal. A large left-sided pneumothorax is present. The left lung is collapsed. There is some shift of the mediastinum to the right. The right lung is clear. IMPRESSION: 1. Large left pneumothorax with some shift suggesting tension pneumothorax. Critical Value/emergent results were called by telephone at the time of interpretation on 01/11/2019 at 6:04 am to Dr. Merrily Pew , who verbally acknowledged these results. Electronically Signed   By: San Morelle M.D.   On: 01/11/2019 06:04  I personally reviewed the CXR images and concur with the findings noted above  Review of Systems  Constitutional: Negative for chills and fever.  Eyes: Negative for blurred vision and double vision.  Respiratory: Positive for shortness of breath. Negative for cough and wheezing.   Cardiovascular: Positive for chest pain (left sided). Negative for palpitations, orthopnea and leg swelling.  Genitourinary: Negative for dysuria and frequency.  Musculoskeletal: Positive for back pain (scoliosis).  Neurological: Negative for seizures, loss of consciousness and weakness.  Psychiatric/Behavioral: Positive for depression and substance abuse. Negative for suicidal ideas.  All other systems reviewed and are negative.   Blood pressure (!) 130/93, pulse (!) 50, temperature 98.2 F (36.8 C), temperature source Oral, resp. rate 14, height 5\' 7"  (1.702 m), weight 63.5 kg, SpO2 100 %. Physical Exam  Vitals reviewed. Constitutional: He is oriented to person, place, and time. He appears well-developed and well-nourished. No distress.  HENT:  Head: Normocephalic and atraumatic.  Eyes: Pupils are equal, round, and reactive to light. Conjunctivae and EOM are normal. No scleral icterus.  Neck: Neck supple. No thyromegaly present.  Cardiovascular:  Normal rate, regular rhythm and normal heart sounds. Exam reveals no gallop and no friction rub.  No murmur heard. Respiratory: Effort normal and breath sounds normal. No respiratory distress. He has no wheezes. He has no rales.  Chest tube in place with air leak  GI: Soft. He exhibits no distension. There is no abdominal tenderness.  Musculoskeletal:        General: No edema.  Lymphadenopathy:    He has no cervical adenopathy.  Neurological: He is alert and oriented to person, place, and time.  No cranial nerve deficit. He exhibits normal muscle tone. Coordination normal.  Skin: Skin is warm and dry.     Assessment/Plan 33 yo man who presents with left chest pain and shortness of breath. 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 Chest placed by ED with reexpansion of the lung.  Admit CT to suction Pain control  Since this is a second spontaneous pneumothorax on the left side, surgical correction is indicated. I discussed left VATS for stapling of blebs with Mr. Brafford. Will tentatively plan for Wednesday. Will obtain CT chest for operative planning.  Anxiety- continue hydroxyzine Depression- not currently taking antidepressant  Melrose Nakayama, MD 01/11/2019, 7:47 AM

## 2019-01-11 NOTE — ED Notes (Signed)
cardiothoracic surgery at bedside

## 2019-01-11 NOTE — ED Triage Notes (Signed)
Pt BIB EMS c/o sudden onset of SOB and CP impacting the left side. Upon EMS arrival pt. 88% on room air, pt. Places on nonrebreather. Pt. HX pf spontaneous  pneumothorax.   EMS VS T 97.7 BP 126/83 HR 80s NSR

## 2019-01-11 NOTE — ED Provider Notes (Signed)
Emergency Department Provider Note   I have reviewed the triage vital signs and the nursing notes.   HISTORY  Chief Complaint Shortness of Breath and Chest Pain   HPI Xavier Howell is a 33 y.o. male with a history of pneumothorax and psychiatric issues who presents the emergency room today with acute onset of sharp left-sided chest pain, dyspnea similar to previous episode pneumothorax.  EMS called and had room air saturations of 88%.  Had some intermittent bradycardia but no tachycardia or hypotension.  He had diminished breath sounds in the left and started nonrebreather brought here for further evaluation.  Patient without any recent illnesses, trauma.  Told he had his last one because he was skinny.  No other associated or modifying symptoms.   No past medical history on file.  Patient Active Problem List   Diagnosis Date Noted  . PTSD (post-traumatic stress disorder) 02/19/2017  . Cannabis use disorder, moderate, dependence ([place]) 02/19/2017  . Severe recurrent major depressive disorder with psychotic features ([place]) 02/18/2017  . Pneumothorax on left 03/02/2016    No past surgical history on file.  Current Outpatient Rx  . Order #: [id number] Class: No Print  . Order #: [id number] Class: Normal  . Order #: [id number] Class: Normal  . Order #: [id number] Class: Normal  . Order #: [id number] Class: Normal  . Order #: [id number] Class: Normal    Allergies Patient has no known allergies.  Family History  Problem Relation Age of Onset  . Alcoholism Mother   . Cirrhosis Mother   . Cancer Sister   . Cirrhosis Brother   . Lupus Other     Social History Social History   Tobacco Use  . Smoking status: Current Every Day Smoker    Packs/day: 0.50  . Smokeless tobacco: Never Used  Substance Use Topics  . Alcohol use: No    Comment: socially  . Drug use: No    Review of Systems  All other systems negative except as documented in the HPI. All pertinent positives and  negatives as reviewed in the HPI. ____________________________________________   PHYSICAL EXAM:  VITAL SIGNS: Vitals:   01/11/19 0619 01/11/19 0624 01/11/19 0630 01/11/19 0700  BP: (!) 134/94 (!) 134/94 124/85 130/86  Pulse:  (!) 49    Resp: 20 14 (!) 8 12  Temp:      TempSrc:      SpO2: 99% 100%    Weight:      Height:        Constitutional: Alert and oriented. Well appearing and in no acute distress. Eyes: Conjunctivae are normal. PERRL. EOMI. Head: Atraumatic. Nose: No congestion/rhinnorhea. Mouth/Throat: Mucous membranes are moist.  Oropharynx non-erythematous. Neck: No stridor.  No meningeal signs.   Cardiovascular: Normal rate, regular rhythm. Good peripheral circulation. Grossly normal heart sounds.   Respiratory: tachypneic respiratory effort.  No retractions. Lungs diminished on left. Gastrointestinal: Soft and nontender. No distention.  Musculoskeletal: No lower extremity tenderness nor edema. No gross deformities of extremities. Neurologic:  Normal speech and language. No gross focal neurologic deficits are appreciated.  Skin:  Skin is warm, dry and intact. No rash noted.  ____________________________________________   LABS (all labs ordered are listed, but only abnormal results are displayed)  Labs Reviewed  CBC WITH DIFFERENTIAL/PLATELET - Abnormal; Notable for the following components:      Result Value   Lymphs Abs 4.4 (*)    All other components within normal limits  COMPREHENSIVE METABOLIC PANEL - Abnormal; Notable for the following components:   Potassium  3.2 (*)    Glucose, Bld 164 (*)    All other components within normal limits  PROTIME-INR   ____________________________________________  EKG   EKG Interpretation  Date/Time:    Ventricular Rate:    PR Interval:    QRS Duration:   QT Interval:    QTC Calculation:   R Axis:     Text Interpretation:         ____________________________________________  RADIOLOGY  Dg Chest Portable  1 View  Result Date: 01/11/2019 CLINICAL DATA:  Chest tube placement. EXAM: PORTABLE CHEST 1 VIEW COMPARISON:  One-view chest x-ray 01/11/2019 at 5:28 a.m. FINDINGS: Heart size is normal.  Mediastinal shift is corrected. A left-sided chest tube is now in place. The left lung is re-expanded. A small apical pneumothorax remains. Small pleural effusion is suspected. Linear atelectasis is present in the left midlung. Right lung is clear. IMPRESSION: 1. Re-expansion of left lung following chest tube placement with small residual apical pneumothorax. 2. Probable small left pleural effusion. 3. No midline shift 4. Linear atelectasis in the left midlung. Electronically Signed   By: San Morelle M.D.   On: 01/11/2019 07:07   Dg Chest Portable 1 View  Result Date: 01/11/2019 CLINICAL DATA:  Hervey Ard left-sided chest pain. History of pneumothorax. EXAM: PORTABLE CHEST 1 VIEW COMPARISON:  Two-view chest x-ray 03/25/16 FINDINGS: Heart size is normal. A large left-sided pneumothorax is present. The left lung is collapsed. There is some shift of the mediastinum to the right. The right lung is clear. IMPRESSION: 1. Large left pneumothorax with some shift suggesting tension pneumothorax. Critical Value/emergent results were called by telephone at the time of interpretation on 01/11/2019 at 6:04 am to Dr. Merrily Pew , who verbally acknowledged these results. Electronically Signed   By: San Morelle M.D.   On: 01/11/2019 06:04    ____________________________________________   PROCEDURES  Procedure(s) performed:   CHEST TUBE INSERTION Date/Time: 01/11/2019 7:07 AM Performed by: Merrily Pew, MD Authorized by: Merrily Pew, MD   Consent:    Consent obtained:  Emergent situation and verbal   Consent given by:  Patient   Risks discussed:  Bleeding, incomplete drainage, infection, damage to surrounding structures, pain and nerve damage   Alternatives discussed:  No treatment and delayed treatment  Pre-procedure details:    Skin preparation:  ChloraPrep   Preparation: Patient was prepped and draped in the usual sterile fashion   Anesthesia (see MAR for exact dosages):    Anesthesia method:  Local infiltration   Local anesthetic:  Lidocaine 1% w/o epi Procedure details:    Placement location:  L lateral   Scalpel size:  11   Tube size (Fr):  16   Ultrasound guidance: no     Tension pneumothorax: no     Tube connected to:  Suction and water seal   Drainage characteristics:  Air only   Suture material:  2-0 silk   Dressing:  Xeroform gauze Post-procedure details:    Post-insertion x-ray findings: tube in good position     Patient tolerance of procedure:  Tolerated well, no immediate complications  .Critical Care Performed by: Merrily Pew, MD Authorized by: Merrily Pew, MD   Critical care provider statement:    Critical care time (minutes):  45   Critical care was necessary to treat or prevent imminent or life-threatening deterioration of the following conditions:  Respiratory failure   Critical care was time spent personally by me on the following activities:  Discussions with consultants, evaluation of patient's  response to treatment, examination of patient, ordering and performing treatments and interventions, ordering and review of laboratory studies, ordering and review of radiographic studies, pulse oximetry, re-evaluation of patient's condition, obtaining history from patient or surrogate and review of old charts   ____________________________________________   INITIAL IMPRESSION / [place] / ED COURSE  Likely recurrent PTX, will eval for same.   X-ray with obvious large left pneumothorax with mild mediastinal shift.  Chest tube inserted emergently as per above procedure note.  Not exactly perfect position but significant improvement in his dyspnea and lung expansion.  Discussed with Dr. Roxan Hockey who will see the patient for admission.  Pertinent  labs & imaging results that were available during my care of the patient were reviewed by me and considered in my medical decision making (see chart for details).  FINAL CLINICAL IMPRESSION(S) / ED DIAGNOSES  Final diagnoses:  Spontaneous pneumothorax     MEDICATIONS GIVEN DURING THIS VISIT:  Medications  HYDROmorphone (DILAUDID) injection 1 mg (1 mg Intravenous Given 01/11/19 0655)  fentaNYL (SUBLIMAZE) injection 50 mcg (50 mcg Intravenous Given 01/11/19 0539)  lidocaine-EPINEPHrine (XYLOCAINE W/EPI) 2 %-1:200000 (PF) injection 20 mL (20 mLs Intradermal Given 01/11/19 0549)  fentaNYL (SUBLIMAZE) injection 50 mcg (50 mcg Intravenous Given 01/11/19 0549)  HYDROmorphone (DILAUDID) injection 0.5 mg (0.5 mg Intravenous Given 01/11/19 0605)     NEW OUTPATIENT MEDICATIONS STARTED DURING THIS VISIT:  New Prescriptions   No medications on file    Note:  This note was prepared with assistance of Dragon voice recognition software. Occasional wrong-word or sound-a-like substitutions may have occurred due to the inherent limitations of voice recognition software.   Wilberta Dorvil, Corene Cornea, MD 01/11/19 [id number]

## 2019-01-11 NOTE — Plan of Care (Signed)
  Problem: Education: Goal: Knowledge of General Education information will improve Description Including pain rating scale, medication(s)/side effects and non-pharmacologic comfort measures Outcome: Progressing   Problem: Health Behavior/Discharge Planning: Goal: Ability to manage health-related needs will improve Outcome: Progressing   Problem: Clinical Measurements: Goal: Cardiovascular complication will be avoided Outcome: Progressing   Problem: Coping: Goal: Level of anxiety will decrease Outcome: Progressing   Problem: Pain Managment: Goal: General experience of comfort will improve Outcome: Progressing   Problem: Clinical Measurements: Goal: Respiratory complications will improve Outcome: Not Progressing   Problem: Elimination: Goal: Will not experience complications related to bowel motility Outcome: Completed/Met Goal: Will not experience complications related to urinary retention Outcome: Completed/Met

## 2019-01-11 NOTE — Discharge Summary (Addendum)
Physician Discharge Summary  Patient ID: Xavier Howell MRN: [id number] DOB/AGE: [birth year] 33 y.o.  Admit date: 01/11/2019 Discharge date: 01/16/2019  Admission Diagnoses: Recurrent left spontaneous pneumothorax  Patient Active Problem List   Diagnosis Date Noted  . Spontaneous pneumothorax 01/11/2019  . PTSD (post-traumatic stress disorder) 02/19/2017  . Cannabis use disorder, moderate, dependence ([place]) 02/19/2017  . Severe recurrent major depressive disorder with psychotic features ([place]) 02/18/2017  . Pneumothorax on left 03/02/2016   Discharge Diagnoses: Recurrent left spontaneous pneumothorax  Patient Active Problem List   Diagnosis Date Noted  . Recurrent spontaneous pneumothorax 01/13/2019  . Spontaneous pneumothorax 01/11/2019  . PTSD (post-traumatic stress disorder) 02/19/2017  . Cannabis use disorder, moderate, dependence ([place]) 02/19/2017  . Severe recurrent major depressive disorder with psychotic features ([place]) 02/18/2017  . Pneumothorax on left 03/02/2016   Discharged Condition: good  History of Present Illness:  Xavier Howell is a 33 yo AA male with known history of left spontaneous pneumothorax, depression, PTSD, and cannabis use.  He presented to the ED on 01/11/2019 with complaints of left sided chest pain and shortness of breath.  He states he woke up at 3 AM and had no symptoms.  However he bent over and when he stood back up he developed the left sided pain with shortness of breath.  He contacted EMS and was transported to the ED.  Workup showed a large left pneumothorax.  He underwent CT placement by the ED physician with follow up CXR showing re-expansion of the lung.  Cardiothoracic surgery consult was requested for admission.  Hospital Course:   He was evaluated by Dr. Roxan Hockey at which time he complained of pain at his chest tube site.  His shortness of breath had resolved.  His chest tube will remain on suction.  CT of the chest was obtained and showed  small apical bleb.  Due to this being a recurrence of pneumothorax for the patient left VATS was indicated.  The risks and benefits of the procedure were explained to the patient and he was agreeable to proceed.  He was taken to the operating room on 01/13/2019.  He underwent  Left VATS with stapling of blebs, pleural abrasion, and intercostal nerve block.  He tolerated the procedure without difficulty, was extubated and taken to the PACU in stable condition.  The patient did well post operatively.  His chest tube was free from air leak and placed on water seal on 01/14/2019.  Follow CXR showed minimal left apical space.  His chest tube was removed on 01/15/2019.  Follow up CXR remains stable.  The patients pain is well controlled.  He is ambulating independently.   He is medically stable for discharge home today.  Significant Diagnostic Studies:   CT Scan: 1. Small left-sided pneumothorax with left-sided chest tube in place which appears appropriately located. 2. Small amount of scarring and/or subsegmental atelectasis throughout the left lung, most evident in the basal segments of the left lower lobe.  Treatments: surgery:    Left video-assisted thoracoscopic surgery stapling of blebs from upper and lower lobe, pleural abrasion, and intercostal nerve block.  Discharge Exam: Blood pressure 127/73, pulse 66, temperature 98.7 F (37.1 C), temperature source Oral, resp. rate 20, height 5\' 7"  (1.702 m), weight 63.5 kg, SpO2 98 %.   General appearance: alert, cooperative and no distress Heart: regular rate and rhythm Lungs: clear to auscultation bilaterally Abdomen: soft, non-tender; bowel sounds normal; no masses,  no organomegaly Wound: clean and dry   Discharge disposition:  01-Home or Self Care  Discharge Medications:   Allergies as of 01/16/2019   No Known Allergies     Medication List    TAKE these medications   acetaminophen 325 MG tablet Commonly known as:  TYLENOL Take 2  tablets (650 mg total) by mouth every 6 (six) hours as needed for moderate pain.   FLUoxetine 20 MG capsule Commonly known as:  PROZAC Take 1 capsule (20 mg total) by mouth daily. For depression   hydrOXYzine 25 MG tablet Commonly known as:  ATARAX/VISTARIL Take 1 tablet (25 mg total) by mouth 3 (three) times daily as needed for anxiety.   ibuprofen 400 MG tablet Commonly known as:  ADVIL Take 1 tablet (400 mg total) by mouth every 6 (six) hours as needed for moderate pain.   nicotine 21 mg/24hr patch Commonly known as:  NICODERM CQ - dosed in mg/24 hours Place 1 patch (21 mg total) onto the skin daily. For smoking cessation What changed:    when to take this  reasons to take this  additional instructions   OLANZapine 7.5 MG tablet Commonly known as:  ZYPREXA Take 1 tablet (7.5 mg total) by mouth at bedtime. For mood control   oxyCODONE 5 MG immediate release tablet Commonly known as:  Oxy IR/ROXICODONE Take 1-2 tablets (5-10 mg total) by mouth every 4 (four) hours as needed for severe pain.   traZODone 50 MG tablet Commonly known as:  DESYREL Take 1 tablet (50 mg) Q bedtime: For sleep      Follow-up Information    [facility] [place] Follow up on 02/02/2019.   Specialty:  Cardiothoracic Surgery Why:  Appointment is at 1:00, please get CXR at 12:30 at [facility] located on first floor of our office building Contact information: [place], [place] [phone]          Signed:  [name], PA-C 01/16/2019, 7:53 AM

## 2019-01-11 NOTE — ED Notes (Signed)
ED TO INPATIENT HANDOFF REPORT  ED Nurse Name and Phone #: [phone]  S Name/Age/Gender Xavier Howell 33 y.o. male Room/Bed: 030C/030C  Code Status   Code Status: Prior  Home/SNF/Other Home Patient oriented to: self, place, time and situation Is this baseline? Yes   Triage Complete: Triage complete  Chief Complaint cp  Triage Note Pt BIB EMS c/o sudden onset of SOB and CP impacting the left side. Upon EMS arrival pt. 88% on room air, pt. Places on nonrebreather. Pt. HX pf spontaneous  pneumothorax.   EMS VS T 97.7 BP 126/83 HR 80s NSR     Allergies No Known Allergies  Level of Care/Admitting Diagnosis ED Disposition    ED Disposition Condition Comment   Admit  Hospital Area: [facility] [[id number]]  Level of Care: Telemetry Surgical [105]  Covid Evaluation: N/A  Diagnosis: Spontaneous pneumothorax [767209]  Admitting Physician: Melrose Nakayama [[id number]]  Attending Physician: Melrose Nakayama [[id number]]  Estimated length of stay: 3 - 4 days  Certification:: I certify this patient will need inpatient services for at least 2 midnights  Bed request comments: mc- 4E  PT Class (Do Not Modify): Inpatient [101]  PT Acc Code (Do Not Modify): Private [1]       B Medical/Surgery History No past medical history on file. No past surgical history on file.   A IV Location/Drains/Wounds Patient Lines/Drains/Airways Status   Active Line/Drains/Airways    Name:   Placement date:   Placement time:   Site:   Days:   Peripheral IV 01/11/19 Right Antecubital   01/11/19    0531    Antecubital   less than 1   Chest Tube Left Pleural 14 Fr.   01/11/19    0550    Pleural   less than 1          Intake/Output Last 24 hours No intake or output data in the 24 hours ending 01/11/19 4709  Labs/Imaging Results for orders placed or performed during the hospital encounter of 01/11/19 (from the past 48 hour(s))  CBC with Differential     Status:  Abnormal   Collection Time: 01/11/19  5:28 AM  Result Value Ref Range   WBC 7.3 4.0 - 10.5 K/uL   RBC 4.23 4.22 - 5.81 MIL/uL   Hemoglobin 13.6 13.0 - 17.0 g/dL   HCT 41.6 39.0 - 52.0 %   MCV 98.3 80.0 - 100.0 fL   MCH 32.2 26.0 - 34.0 pg   MCHC 32.7 30.0 - 36.0 g/dL   RDW 12.4 11.5 - 15.5 %   Platelets 223 150 - 400 K/uL   nRBC 0.0 0.0 - 0.2 %   Neutrophils Relative % 29 %   Neutro Abs 2.1 1.7 - 7.7 K/uL   Lymphocytes Relative 60 %   Lymphs Abs 4.4 (H) 0.7 - 4.0 K/uL   Monocytes Relative 8 %   Monocytes Absolute 0.6 0.1 - 1.0 K/uL   Eosinophils Relative 2 %   Eosinophils Absolute 0.2 0.0 - 0.5 K/uL   Basophils Relative 1 %   Basophils Absolute 0.1 0.0 - 0.1 K/uL   Immature Granulocytes 0 %   Abs Immature Granulocytes 0.01 0.00 - 0.07 K/uL    Comment: Performed at [facility], [address], [place],  [address]  Comprehensive metabolic panel     Status: Abnormal   Collection Time: 01/11/19  5:28 AM  Result Value Ref Range   Sodium 139 135 - 145 mmol/L  Potassium 3.2 (L) 3.5 - 5.1 mmol/L   Chloride 105 98 - 111 mmol/L   CO2 26 22 - 32 mmol/L   Glucose, Bld 164 (H) 70 - 99 mg/dL   BUN 12 6 - 20 mg/dL   Creatinine, Ser 1.21 0.61 - 1.24 mg/dL   Calcium 8.9 8.9 - 10.3 mg/dL   Total Protein 6.6 6.5 - 8.1 g/dL   Albumin 4.0 3.5 - 5.0 g/dL   AST 22 15 - 41 U/L   ALT 14 0 - 44 U/L   Alkaline Phosphatase 49 38 - 126 U/L   Total Bilirubin 0.6 0.3 - 1.2 mg/dL   GFR calc non Af Amer >60 >60 mL/min   GFR calc Af Amer >60 >60 mL/min   Anion gap 8 5 - 15    Comment: Performed at [facility] [address] [place], [place] [address]  Protime-INR     Status: None   Collection Time: 01/11/19  5:28 AM  Result Value Ref Range   Prothrombin Time 14.2 11.4 - 15.2 seconds   INR 1.1 0.8 - 1.2    Comment: (NOTE) INR goal varies based on device and disease states. Performed at [facility], [place] [address] [place], [place] [address]    Dg Chest Portable 1  View  Result Date: 01/11/2019 CLINICAL DATA:  Chest tube placement. EXAM: PORTABLE CHEST 1 VIEW COMPARISON:  One-view chest x-ray 01/11/2019 at 5:28 a.m. FINDINGS: Heart size is normal.  Mediastinal shift is corrected. A left-sided chest tube is now in place. The left lung is re-expanded. A small apical pneumothorax remains. Small pleural effusion is suspected. Linear atelectasis is present in the left midlung. Right lung is clear. IMPRESSION: 1. Re-expansion of left lung following chest tube placement with small residual apical pneumothorax. 2. Probable small left pleural effusion. 3. No midline shift 4. Linear atelectasis in the left midlung. Electronically Signed   By: San Morelle M.D.   On: 01/11/2019 07:07   Dg Chest Portable 1 View  Result Date: 01/11/2019 CLINICAL DATA:  Hervey Ard left-sided chest pain. History of pneumothorax. EXAM: PORTABLE CHEST 1 VIEW COMPARISON:  Two-view chest x-ray 03/25/16 FINDINGS: Heart size is normal. A large left-sided pneumothorax is present. The left lung is collapsed. There is some shift of the mediastinum to the right. The right lung is clear. IMPRESSION: 1. Large left pneumothorax with some shift suggesting tension pneumothorax. Critical Value/emergent results were called by telephone at the time of interpretation on 01/11/2019 at 6:04 am to Dr. Merrily Pew , who verbally acknowledged these results. Electronically Signed   By: San Morelle M.D.   On: 01/11/2019 06:04    Pending Labs Unresulted Labs (From admission, onward)   None      Vitals/Pain Today's Vitals   01/11/19 0745 01/11/19 0800 01/11/19 0808 01/11/19 0808  BP: 134/90 127/87    Pulse:  (!) 47    Resp: 12 12    Temp:      TempSrc:      SpO2:  100%    Weight:      Height:      PainSc:   5  5     Isolation Precautions No active isolations  Medications Medications  HYDROmorphone (DILAUDID) injection 1 mg (1 mg Intravenous Given 01/11/19 0655)  hydrOXYzine (ATARAX/VISTARIL)  tablet 25 mg (has no administration in time range)  nicotine (NICODERM CQ - dosed in mg/24 hours) patch 21 mg (has no administration in time range)  enoxaparin (  LOVENOX) injection 40 mg (has no administration in time range)  acetaminophen (TYLENOL) tablet 1,000 mg (has no administration in time range)  ketorolac (TORADOL) 15 MG/ML injection 15 mg (15 mg Intravenous Given 01/11/19 0817)  naloxone (NARCAN) injection 0.4 mg (has no administration in time range)    And  sodium chloride flush (NS) 0.9 % injection 9 mL (has no administration in time range)  ondansetron (ZOFRAN) injection 4 mg (has no administration in time range)  HYDROmorphone (DILAUDID) 1 mg/mL PCA injection (has no administration in time range)  fentaNYL (SUBLIMAZE) injection 50 mcg (50 mcg Intravenous Given 01/11/19 0539)  lidocaine-EPINEPHrine (XYLOCAINE W/EPI) 2 %-1:200000 (PF) injection 20 mL (20 mLs Intradermal Given 01/11/19 0549)  fentaNYL (SUBLIMAZE) injection 50 mcg (50 mcg Intravenous Given 01/11/19 0549)  HYDROmorphone (DILAUDID) injection 0.5 mg (0.5 mg Intravenous Given 01/11/19 0605)    Mobility walks with person assist Low fall risk   Focused Assessments Pulmonary Assessment Handoff:  Lung sounds: L Breath Sounds: Diminished R Breath Sounds: Clear O2 Device: Nasal Cannula O2 Flow Rate (L/min): 2 L/min      R Recommendations: See Admitting Provider Note  Report given to:   Additional Notes:

## 2019-01-12 ENCOUNTER — Inpatient Hospital Stay (HOSPITAL_COMMUNITY): Payer: 59

## 2019-01-12 LAB — BLOOD GAS, ARTERIAL
Acid-Base Excess: 3.2 mmol/L — ABNORMAL HIGH (ref 0.0–2.0)
Bicarbonate: 27.5 mmol/L (ref 20.0–28.0)
Drawn by: 511331
FIO2: 21
O2 Saturation: 96.9 %
Patient temperature: 98.6
pCO2 arterial: 44.3 mmHg (ref 32.0–48.0)
pH, Arterial: 7.41 (ref 7.350–7.450)
pO2, Arterial: 94.7 mmHg (ref 83.0–108.0)

## 2019-01-12 LAB — URINALYSIS, ROUTINE W REFLEX MICROSCOPIC
Bacteria, UA: NONE SEEN
Bilirubin Urine: NEGATIVE
Glucose, UA: NEGATIVE mg/dL
Ketones, ur: 5 mg/dL — AB
Leukocytes,Ua: NEGATIVE
Nitrite: NEGATIVE
Protein, ur: NEGATIVE mg/dL
Specific Gravity, Urine: 1.015 (ref 1.005–1.030)
pH: 7 (ref 5.0–8.0)

## 2019-01-12 LAB — COMPREHENSIVE METABOLIC PANEL
ALT: 14 U/L (ref 0–44)
AST: 17 U/L (ref 15–41)
Albumin: 3.9 g/dL (ref 3.5–5.0)
Alkaline Phosphatase: 55 U/L (ref 38–126)
Anion gap: 10 (ref 5–15)
BUN: 11 mg/dL (ref 6–20)
CO2: 25 mmol/L (ref 22–32)
Calcium: 9.1 mg/dL (ref 8.9–10.3)
Chloride: 101 mmol/L (ref 98–111)
Creatinine, Ser: 1.09 mg/dL (ref 0.61–1.24)
GFR calc Af Amer: >60 mL/min (ref >60)
GFR calc non Af Amer: >60 mL/min (ref >60)
Glucose, Bld: 91 mg/dL (ref 70–99)
Potassium: 3.6 mmol/L (ref 3.5–5.1)
Sodium: 136 mmol/L (ref 135–145)
Total Bilirubin: 1.2 mg/dL (ref 0.3–1.2)
Total Protein: 6.9 g/dL (ref 6.5–8.1)

## 2019-01-12 LAB — CBC
HCT: 40.6 % (ref 39.0–52.0)
Hemoglobin: 14.1 g/dL (ref 13.0–17.0)
MCH: 32.7 pg (ref 26.0–34.0)
MCHC: 34.7 g/dL (ref 30.0–36.0)
MCV: 94.2 fL (ref 80.0–100.0)
Platelets: 200 10*3/uL (ref 150–400)
RBC: 4.31 MIL/uL (ref 4.22–5.81)
RDW: 11.9 % (ref 11.5–15.5)
WBC: 5.5 10*3/uL (ref 4.0–10.5)
nRBC: 0 % (ref 0.0–0.2)

## 2019-01-12 LAB — TYPE AND SCREEN
ABO/RH(D): O POS
Antibody Screen: NEGATIVE
Unit division: 0

## 2019-01-12 LAB — BPAM RBC
Blood Product Expiration Date: 202005052359
Unit Type and Rh: 5100

## 2019-01-12 LAB — PROTIME-INR
INR: 1.1 (ref 0.8–1.2)
Prothrombin Time: 14.3 seconds (ref 11.4–15.2)

## 2019-01-12 LAB — ABO/RH: ABO/RH(D): O POS

## 2019-01-12 MED ORDER — CEFAZOLIN SODIUM-DEXTROSE 2-4 GM/100ML-% IV SOLN
2.0000 g | INTRAVENOUS | Status: AC
  Administered 2019-01-13: 2 g via INTRAVENOUS
  Filled 2019-01-12: qty 100

## 2019-01-12 NOTE — Progress Notes (Addendum)
      [address]       [place] [address]             [phone]      Procedure(s) (LRB): VIDEO ASSISTED THORACOSCOPY (Left) STAPLING OF BLEBS (Left)  Subjective:  Complains of pain at chest tube site.  Says chest pain has resolved.   Objective: Vital signs in last 24 hours: Temp:  [97.4 F (36.3 C)-97.9 F (36.6 C)] 97.9 F (36.6 C) (04/28 0624) Pulse Rate:  [51-66] 66 (04/28 0624) Cardiac Rhythm: Sinus bradycardia (04/27 2000) Resp:  [10-19] 15 (04/28 0624) BP: (129-135)/(84-92) 129/85 (04/28 0624) SpO2:  [98 %-100 %] 99 % (04/28 0624)  Intake/Output from previous day: 04/27 0701 - 04/28 0700 In: 0  Out: 18 [Chest Tube:18]  General appearance: alert, cooperative and no distress Heart: regular rate and rhythm Lungs: clear to auscultation bilaterally Abdomen: soft, non-tender; bowel sounds normal; no masses,  no organomegaly Extremities: extremities normal, atraumatic, no cyanosis or edema Wound: clean and dry  Lab Results: Recent Labs    01/11/19 0528  WBC 7.3  HGB 13.6  HCT 41.6  PLT 223   BMET:  Recent Labs    01/11/19 0528  NA 139  K 3.2*  CL 105  CO2 26  GLUCOSE 164*  BUN 12  CREATININE 1.21  CALCIUM 8.9    PT/INR:  Recent Labs    01/11/19 0528  LABPROT 14.2  INR 1.1   ABG No results found for: PHART, HCO3, TCO2, ACIDBASEDEF, O2SAT CBG (last 3)  No results for input(s): GLUCAP in the last 72 hours.  Assessment/Plan: S/P Procedure(s) (LRB): VIDEO ASSISTED THORACOSCOPY (Left) STAPLING OF BLEBS (Left)  1. Chest tube- + air leak, CXR with apical pneumothorax- will leave chest tube on suction today 2. Dispo- patient stable, will leave chest tube to suction today, plan for VATS tomorrow afternoon, NPO at midnight  LOS: 1 day    [name] 01/12/2019 Patient seen and examined, agree with above. CT showed minimal bleb disease, some scarring in left lower lobe I recommended we proceed with left VATS for  stapling of the air leak and pleural abrasion. I informed him of the general nature of the procedure, the incisions to be used and the use of a chest tube postoperatively. I discussed the indications, risks, benefits and alternatives with him. He understands the risks include, but are not limited to death, MI, DVT, PE, bleeding, possible need for transfusion, infection, prolonged air leak, as well as the possibility of other unforeseeable complications.  He accepts the risks and agrees to proceed.  For Left VATS, stapling of air leak and pleural abrasion tomorrow 2nd case  Revonda Standard. Roxan Hockey, MD [facility] [phone]

## 2019-01-12 NOTE — Progress Notes (Signed)
Noticed dried blood coming from patient's chest tube. No active bleeding. Chest tube dressing change with xeroform, drain sponges and gauze with tape. Patient tolerated well. Will continue to monitor

## 2019-01-12 NOTE — Progress Notes (Signed)
Pt called nurse to room because of chest pain. Pt said pain was around area of pigtail chest tube. Asked patient to cough. Air leak noticed to second chamber. Butch Penny, RN (CN) and Clarene Critchley, RN also evaluated patient with this nurse. Connections were checked and found secure. Dressing was removed and site assessed. Catheter exit site redressed with xeroform, split gauze and dry 4x4s. Sealed with hypofix tape. Pt asked to cough again. Air leak no longer visible. Pt auscultated bilaterally and BS clear. Connections were secured with pink tape. Pt denies any further pain in chest. Resting comfortably in bed. Will continue to monitor. Lajoyce Corners, RN

## 2019-01-12 NOTE — H&P (View-Only) (Signed)
      [address]       [place] [address]             [phone]      Procedure(s) (LRB): VIDEO ASSISTED THORACOSCOPY (Left) STAPLING OF BLEBS (Left)  Subjective:  Complains of pain at chest tube site.  Says chest pain has resolved.   Objective: Vital signs in last 24 hours: Temp:  [97.4 F (36.3 C)-97.9 F (36.6 C)] 97.9 F (36.6 C) (04/28 0624) Pulse Rate:  [51-66] 66 (04/28 0624) Cardiac Rhythm: Sinus bradycardia (04/27 2000) Resp:  [10-19] 15 (04/28 0624) BP: (129-135)/(84-92) 129/85 (04/28 0624) SpO2:  [98 %-100 %] 99 % (04/28 0624)  Intake/Output from previous day: 04/27 0701 - 04/28 0700 In: 0  Out: 18 [Chest Tube:18]  General appearance: alert, cooperative and no distress Heart: regular rate and rhythm Lungs: clear to auscultation bilaterally Abdomen: soft, non-tender; bowel sounds normal; no masses,  no organomegaly Extremities: extremities normal, atraumatic, no cyanosis or edema Wound: clean and dry  Lab Results: Recent Labs    01/11/19 0528  WBC 7.3  HGB 13.6  HCT 41.6  PLT 223   BMET:  Recent Labs    01/11/19 0528  NA 139  K 3.2*  CL 105  CO2 26  GLUCOSE 164*  BUN 12  CREATININE 1.21  CALCIUM 8.9    PT/INR:  Recent Labs    01/11/19 0528  LABPROT 14.2  INR 1.1   ABG No results found for: PHART, HCO3, TCO2, ACIDBASEDEF, O2SAT CBG (last 3)  No results for input(s): GLUCAP in the last 72 hours.  Assessment/Plan: S/P Procedure(s) (LRB): VIDEO ASSISTED THORACOSCOPY (Left) STAPLING OF BLEBS (Left)  1. Chest tube- + air leak, CXR with apical pneumothorax- will leave chest tube on suction today 2. Dispo- patient stable, will leave chest tube to suction today, plan for VATS tomorrow afternoon, NPO at midnight  LOS: 1 day    Xavier Howell 01/12/2019 Patient seen and examined, agree with above. CT showed minimal bleb disease, some scarring in left lower lobe I recommended we proceed with left VATS for  stapling of the air leak and pleural abrasion. I informed him of the general nature of the procedure, the incisions to be used and the use of a chest tube postoperatively. I discussed the indications, risks, benefits and alternatives with him. He understands the risks include, but are not limited to death, MI, DVT, PE, bleeding, possible need for transfusion, infection, prolonged air leak, as well as the possibility of other unforeseeable complications.  He accepts the risks and agrees to proceed.  For Left VATS, stapling of air leak and pleural abrasion tomorrow 2nd case  Revonda Standard. Roxan Hockey, MD [facility] [phone]

## 2019-01-13 ENCOUNTER — Inpatient Hospital Stay (HOSPITAL_COMMUNITY): Payer: 59

## 2019-01-13 ENCOUNTER — Encounter (HOSPITAL_COMMUNITY)
Admission: EM | Disposition: A | Discharge status: Home / Self Care | Payer: Self-pay | Source: Home / Self Care | Attending: Thoracic Surgery (Cardiothoracic Vascular Surgery)

## 2019-01-13 ENCOUNTER — Inpatient Hospital Stay (HOSPITAL_COMMUNITY): Payer: 59 | Admitting: Anesthesiology

## 2019-01-13 ENCOUNTER — Encounter (HOSPITAL_COMMUNITY): Payer: Self-pay | Admitting: *Deleted

## 2019-01-13 DIAGNOSIS — J9311 Primary spontaneous pneumothorax: Secondary | ICD-10-CM

## 2019-01-13 DIAGNOSIS — J9383 Other pneumothorax: Secondary | ICD-10-CM | POA: Diagnosis present

## 2019-01-13 HISTORY — PX: STAPLING OF BLEBS: SHX6429

## 2019-01-13 HISTORY — PX: VIDEO ASSISTED THORACOSCOPY: SHX5073

## 2019-01-13 LAB — GLUCOSE, CAPILLARY
Glucose-Capillary: 101 mg/dL — ABNORMAL HIGH (ref 70–99)
Glucose-Capillary: 112 mg/dL — ABNORMAL HIGH (ref 70–99)

## 2019-01-13 LAB — SURGICAL PCR SCREEN
MRSA, PCR: NEGATIVE
Staphylococcus aureus: NEGATIVE

## 2019-01-13 SURGERY — VIDEO ASSISTED THORACOSCOPY
Anesthesia: General | Site: Chest | Laterality: Left

## 2019-01-13 MED ORDER — DEXAMETHASONE SODIUM PHOSPHATE 10 MG/ML IJ SOLN
INTRAMUSCULAR | Status: DC | PRN
  Administered 2019-01-13: 8 mg via INTRAVENOUS

## 2019-01-13 MED ORDER — PROPOFOL 10 MG/ML IV BOLUS
INTRAVENOUS | Status: DC | PRN
  Administered 2019-01-13: 200 mg via INTRAVENOUS

## 2019-01-13 MED ORDER — BISACODYL 5 MG PO TBEC
10.0000 mg | DELAYED_RELEASE_TABLET | Freq: Every day | ORAL | Status: DC
  Administered 2019-01-14: 10 mg via ORAL
  Filled 2019-01-13 (×2): qty 2

## 2019-01-13 MED ORDER — PHENYLEPHRINE 40 MCG/ML (10ML) SYRINGE FOR IV PUSH (FOR BLOOD PRESSURE SUPPORT)
PREFILLED_SYRINGE | INTRAVENOUS | Status: AC
  Filled 2019-01-13: qty 10

## 2019-01-13 MED ORDER — POTASSIUM CHLORIDE IN NACL 20-0.9 MEQ/L-% IV SOLN
INTRAVENOUS | Status: DC
  Administered 2019-01-13 – 2019-01-14 (×2): via INTRAVENOUS
  Filled 2019-01-13 (×4): qty 1000

## 2019-01-13 MED ORDER — ACETAMINOPHEN 160 MG/5ML PO SOLN: 1000.0000 mg | Freq: Four times a day (QID) | ORAL | Status: DC

## 2019-01-13 MED ORDER — ROCURONIUM BROMIDE 50 MG/5ML IV SOSY
PREFILLED_SYRINGE | INTRAVENOUS | Status: DC | PRN
  Administered 2019-01-13: 30 mg via INTRAVENOUS

## 2019-01-13 MED ORDER — POTASSIUM CHLORIDE 10 MEQ/50ML IV SOLN: 10.0000 meq | Freq: Every day | INTRAVENOUS | Status: DC | PRN

## 2019-01-13 MED ORDER — OXYCODONE HCL 5 MG PO TABS: 5.0000 mg | ORAL_TABLET | Freq: Once | ORAL | Status: DC | PRN

## 2019-01-13 MED ORDER — KETOROLAC TROMETHAMINE 30 MG/ML IJ SOLN
INTRAMUSCULAR | Status: AC
  Filled 2019-01-13: qty 1

## 2019-01-13 MED ORDER — SUCCINYLCHOLINE CHLORIDE 200 MG/10ML IV SOSY
PREFILLED_SYRINGE | INTRAVENOUS | Status: AC
  Filled 2019-01-13: qty 10

## 2019-01-13 MED ORDER — LIDOCAINE 2% (20 MG/ML) 5 ML SYRINGE
INTRAMUSCULAR | Status: DC | PRN
  Administered 2019-01-13: 100 mg via INTRAVENOUS

## 2019-01-13 MED ORDER — BUPIVACAINE LIPOSOME 1.3 % IJ SUSP
20.0000 mL | INTRAMUSCULAR | Status: DC
  Filled 2019-01-13: qty 20

## 2019-01-13 MED ORDER — TRAMADOL HCL 50 MG PO TABS: 50.0000 mg | ORAL_TABLET | Freq: Four times a day (QID) | ORAL | Status: DC | PRN

## 2019-01-13 MED ORDER — CEFAZOLIN SODIUM-DEXTROSE 2-4 GM/100ML-% IV SOLN
2.0000 g | Freq: Three times a day (TID) | INTRAVENOUS | Status: AC
  Administered 2019-01-13 – 2019-01-14 (×2): 2 g via INTRAVENOUS
  Filled 2019-01-13 (×2): qty 100

## 2019-01-13 MED ORDER — LACTATED RINGERS IV SOLN
INTRAVENOUS | Status: DC | PRN
  Administered 2019-01-13 (×2): via INTRAVENOUS

## 2019-01-13 MED ORDER — DEXAMETHASONE SODIUM PHOSPHATE 10 MG/ML IJ SOLN
INTRAMUSCULAR | Status: AC
  Filled 2019-01-13: qty 1

## 2019-01-13 MED ORDER — ONDANSETRON HCL 4 MG/2ML IJ SOLN
INTRAMUSCULAR | Status: AC
  Filled 2019-01-13: qty 2

## 2019-01-13 MED ORDER — SODIUM CHLORIDE (PF) 0.9 % IJ SOLN
INTRAMUSCULAR | Status: DC | PRN
  Administered 2019-01-13: 50 mL via INTRAVENOUS

## 2019-01-13 MED ORDER — OXYCODONE HCL 5 MG PO TABS: 5.0000 mg | ORAL_TABLET | ORAL | Status: DC | PRN

## 2019-01-13 MED ORDER — KETOROLAC TROMETHAMINE 30 MG/ML IJ SOLN
INTRAMUSCULAR | Status: DC | PRN
  Administered 2019-01-13: 30 mg via INTRAVENOUS

## 2019-01-13 MED ORDER — INSULIN ASPART 100 UNIT/ML ~~LOC~~ SOLN: 0.0000 [IU] | Freq: Four times a day (QID) | SUBCUTANEOUS | Status: DC

## 2019-01-13 MED ORDER — GLYCOPYRROLATE PF 0.2 MG/ML IJ SOSY
PREFILLED_SYRINGE | INTRAMUSCULAR | Status: DC | PRN
  Administered 2019-01-13: .1 mg via INTRAVENOUS

## 2019-01-13 MED ORDER — MUPIROCIN 2 % EX OINT
1.0000 "application " | TOPICAL_OINTMENT | Freq: Two times a day (BID) | CUTANEOUS | Status: DC
  Administered 2019-01-13: 1 via NASAL
  Filled 2019-01-13: qty 22

## 2019-01-13 MED ORDER — 0.9 % SODIUM CHLORIDE (POUR BTL) OPTIME
TOPICAL | Status: DC | PRN
  Administered 2019-01-13: 2000 mL

## 2019-01-13 MED ORDER — SENNOSIDES-DOCUSATE SODIUM 8.6-50 MG PO TABS
1.0000 | ORAL_TABLET | Freq: Every day | ORAL | Status: DC
  Administered 2019-01-14: 21:00:00 1 via ORAL
  Filled 2019-01-13: qty 1

## 2019-01-13 MED ORDER — EPHEDRINE SULFATE-NACL 50-0.9 MG/10ML-% IV SOSY
PREFILLED_SYRINGE | INTRAVENOUS | Status: DC | PRN
  Administered 2019-01-13: 5 mg via INTRAVENOUS
  Administered 2019-01-13: 10 mg via INTRAVENOUS

## 2019-01-13 MED ORDER — PROMETHAZINE HCL 25 MG/ML IJ SOLN: 6.2500 mg | INTRAMUSCULAR | Status: DC | PRN

## 2019-01-13 MED ORDER — FENTANYL CITRATE (PF) 100 MCG/2ML IJ SOLN
INTRAMUSCULAR | Status: AC
  Administered 2019-01-13: 16:00:00 50 ug via INTRAVENOUS
  Filled 2019-01-13: qty 2

## 2019-01-13 MED ORDER — FENTANYL CITRATE (PF) 100 MCG/2ML IJ SOLN
25.0000 ug | INTRAMUSCULAR | Status: DC | PRN
  Administered 2019-01-13 (×2): 50 ug via INTRAVENOUS

## 2019-01-13 MED ORDER — ONDANSETRON HCL 4 MG/2ML IJ SOLN
INTRAMUSCULAR | Status: DC | PRN
  Administered 2019-01-13: 4 mg via INTRAVENOUS

## 2019-01-13 MED ORDER — OXYCODONE HCL 5 MG/5ML PO SOLN: 5.0000 mg | Freq: Once | ORAL | Status: DC | PRN

## 2019-01-13 MED ORDER — BUPIVACAINE HCL 0.5 % IJ SOLN
INTRAMUSCULAR | Status: AC
  Filled 2019-01-13: qty 1

## 2019-01-13 MED ORDER — FENTANYL CITRATE (PF) 250 MCG/5ML IJ SOLN
INTRAMUSCULAR | Status: AC
  Filled 2019-01-13: qty 5

## 2019-01-13 MED ORDER — ACETAMINOPHEN 500 MG PO TABS
1000.0000 mg | ORAL_TABLET | Freq: Four times a day (QID) | ORAL | Status: DC
  Administered 2019-01-13 – 2019-01-16 (×11): 1000 mg via ORAL
  Filled 2019-01-13 (×11): qty 2

## 2019-01-13 MED ORDER — MIDAZOLAM HCL 5 MG/5ML IJ SOLN
INTRAMUSCULAR | Status: DC | PRN
  Administered 2019-01-13: 2 mg via INTRAVENOUS

## 2019-01-13 MED ORDER — FENTANYL CITRATE (PF) 100 MCG/2ML IJ SOLN
INTRAMUSCULAR | Status: DC | PRN
  Administered 2019-01-13: 150 ug via INTRAVENOUS
  Administered 2019-01-13 (×2): 50 ug via INTRAVENOUS

## 2019-01-13 MED ORDER — MIDAZOLAM HCL 2 MG/2ML IJ SOLN
INTRAMUSCULAR | Status: AC
  Filled 2019-01-13: qty 2

## 2019-01-13 MED ORDER — ONDANSETRON HCL 4 MG/2ML IJ SOLN: 4.0000 mg | Freq: Four times a day (QID) | INTRAMUSCULAR | Status: DC | PRN

## 2019-01-13 MED ORDER — SUCCINYLCHOLINE CHLORIDE 200 MG/10ML IV SOSY
PREFILLED_SYRINGE | INTRAVENOUS | Status: DC | PRN
  Administered 2019-01-13: 120 mg via INTRAVENOUS

## 2019-01-13 MED ORDER — PROPOFOL 10 MG/ML IV BOLUS
INTRAVENOUS | Status: AC
  Filled 2019-01-13: qty 20

## 2019-01-13 MED ORDER — LIDOCAINE 2% (20 MG/ML) 5 ML SYRINGE
INTRAMUSCULAR | Status: AC
  Filled 2019-01-13: qty 5

## 2019-01-13 MED ORDER — SODIUM CHLORIDE 0.9 % IV SOLN
INTRAVENOUS | Status: DC | PRN
  Administered 2019-01-13: 70 mL

## 2019-01-13 MED ORDER — PHENYLEPHRINE 40 MCG/ML (10ML) SYRINGE FOR IV PUSH (FOR BLOOD PRESSURE SUPPORT)
PREFILLED_SYRINGE | INTRAVENOUS | Status: DC | PRN
  Administered 2019-01-13 (×3): 80 ug via INTRAVENOUS

## 2019-01-13 MED ORDER — GLYCOPYRROLATE PF 0.2 MG/ML IJ SOSY
PREFILLED_SYRINGE | INTRAMUSCULAR | Status: AC
  Filled 2019-01-13: qty 1

## 2019-01-13 MED ORDER — ROCURONIUM BROMIDE 10 MG/ML (PF) SYRINGE
PREFILLED_SYRINGE | INTRAVENOUS | Status: AC
  Filled 2019-01-13: qty 10

## 2019-01-13 MED ORDER — LACTATED RINGERS IV SOLN
INTRAVENOUS | Status: DC
  Administered 2019-01-13: 12:00:00 via INTRAVENOUS

## 2019-01-13 SURGICAL SUPPLY — 78 items
APPLICATOR COTTON TIP 6 STRL (MISCELLANEOUS) IMPLANT
APPLICATOR COTTON TIP 6IN STRL (MISCELLANEOUS) IMPLANT
APPLIER CLIP ROT 10 11.4 M/L (STAPLE)
CANISTER SUCT 3000ML PPV (MISCELLANEOUS) ×3 IMPLANT
CATH THORACIC 28FR RT ANG (CATHETERS) ×1 IMPLANT
CATH THORACIC 36FR (CATHETERS) IMPLANT
CATH THORACIC 36FR RT ANG (CATHETERS) IMPLANT
CLEANER TIP ELECTROSURG 2X2 (MISCELLANEOUS) ×1 IMPLANT
CLIP APPLIE ROT 10 11.4 M/L (STAPLE) IMPLANT
CLIP VESOCCLUDE MED 6/CT (CLIP) IMPLANT
CONN Y 3/8X3/8X3/8  BEN (MISCELLANEOUS) ×1
CONN Y 3/8X3/8X3/8 BEN (MISCELLANEOUS) ×2 IMPLANT
CONT SPEC 4OZ CLIKSEAL STRL BL (MISCELLANEOUS) ×7 IMPLANT
COVER SURGICAL LIGHT HANDLE (MISCELLANEOUS) ×3 IMPLANT
COVER WAND RF STERILE (DRAPES) ×3 IMPLANT
CUTTER ECHEON FLEX ENDO 45 340 (ENDOMECHANICALS) ×1 IMPLANT
DERMABOND ADHESIVE PROPEN (GAUZE/BANDAGES/DRESSINGS) ×1
DERMABOND ADVANCED (GAUZE/BANDAGES/DRESSINGS)
DERMABOND ADVANCED .7 DNX12 (GAUZE/BANDAGES/DRESSINGS) IMPLANT
DERMABOND ADVANCED .7 DNX6 (GAUZE/BANDAGES/DRESSINGS) IMPLANT
DRAIN CHANNEL 28F RND 3/8 FF (WOUND CARE) IMPLANT
DRAIN CHANNEL 32F RND 10.7 FF (WOUND CARE) IMPLANT
DRAPE LAPAROSCOPIC ABDOMINAL (DRAPES) ×3 IMPLANT
DRAPE SLUSH/WARMER DISC (DRAPES) ×3 IMPLANT
ELECT BLADE 6.5 EXT (BLADE) ×1 IMPLANT
ELECT REM PT RETURN 9FT ADLT (ELECTROSURGICAL) ×3
ELECTRODE REM PT RTRN 9FT ADLT (ELECTROSURGICAL) ×2 IMPLANT
GAUZE SPONGE 4X4 12PLY STRL (GAUZE/BANDAGES/DRESSINGS) ×3 IMPLANT
GAUZE SPONGE 4X4 12PLY STRL LF (GAUZE/BANDAGES/DRESSINGS) ×1 IMPLANT
GLOVE SURG SIGNA 7.5 PF LTX (GLOVE) ×6 IMPLANT
GOWN STRL REUS W/ TWL LRG LVL3 (GOWN DISPOSABLE) ×4 IMPLANT
GOWN STRL REUS W/ TWL XL LVL3 (GOWN DISPOSABLE) ×2 IMPLANT
GOWN STRL REUS W/TWL LRG LVL3 (GOWN DISPOSABLE) ×2
GOWN STRL REUS W/TWL XL LVL3 (GOWN DISPOSABLE) ×1
HEMOSTAT SURGICEL 2X14 (HEMOSTASIS) IMPLANT
IV CATH 22GX1 FEP (IV SOLUTION) IMPLANT
KIT BASIN OR (CUSTOM PROCEDURE TRAY) ×3 IMPLANT
KIT SUCTION CATH 14FR (SUCTIONS) ×3 IMPLANT
KIT TURNOVER KIT B (KITS) ×3 IMPLANT
NDL SPNL 18GX3.5 QUINCKE PK (NEEDLE) ×2 IMPLANT
NEEDLE SPNL 18GX3.5 QUINCKE PK (NEEDLE) ×3 IMPLANT
NS IRRIG 1000ML POUR BTL (IV SOLUTION) ×6 IMPLANT
PACK CHEST (CUSTOM PROCEDURE TRAY) ×3 IMPLANT
PAD ARMBOARD 7.5X6 YLW CONV (MISCELLANEOUS) ×6 IMPLANT
POUCH ENDO CATCH II 15MM (MISCELLANEOUS) IMPLANT
POUCH SPECIMEN RETRIEVAL 10MM (ENDOMECHANICALS) IMPLANT
RELOAD STAPLE 45 GOLD REG/THCK (STAPLE) IMPLANT
SEALANT PROGEL (MISCELLANEOUS) IMPLANT
SEALANT SURG COSEAL 4ML (VASCULAR PRODUCTS) IMPLANT
SEALANT SURG COSEAL 8ML (VASCULAR PRODUCTS) IMPLANT
SOLUTION ANTI FOG 6CC (MISCELLANEOUS) ×3 IMPLANT
SPECIMEN JAR MEDIUM (MISCELLANEOUS) ×5 IMPLANT
SPONGE INTESTINAL PEANUT (DISPOSABLE) IMPLANT
SPONGE TONSIL TAPE 1 RFD (DISPOSABLE) ×3 IMPLANT
STAPLE RELOAD 45MM GOLD (STAPLE) ×18 IMPLANT
SUT PROLENE 4 0 RB 1 (SUTURE)
SUT PROLENE 4-0 RB1 .5 CRCL 36 (SUTURE) IMPLANT
SUT SILK  1 MH (SUTURE) ×2
SUT SILK 1 MH (SUTURE) ×4 IMPLANT
SUT SILK 2 0SH CR/8 30 (SUTURE) IMPLANT
SUT SILK 3 0SH CR/8 30 (SUTURE) IMPLANT
SUT VIC AB 1 CTX 36 (SUTURE) ×1
SUT VIC AB 1 CTX36XBRD ANBCTR (SUTURE) IMPLANT
SUT VIC AB 2-0 CTX 36 (SUTURE) ×1 IMPLANT
SUT VIC AB 2-0 UR6 27 (SUTURE) IMPLANT
SUT VIC AB 3-0 MH 27 (SUTURE) IMPLANT
SUT VIC AB 3-0 X1 27 (SUTURE) ×3 IMPLANT
SUT VICRYL 2 TP 1 (SUTURE) IMPLANT
SYR 20CC LL (SYRINGE) ×1 IMPLANT
SYSTEM SAHARA CHEST DRAIN ATS (WOUND CARE) ×3 IMPLANT
TAPE CLOTH 4X10 WHT NS (GAUZE/BANDAGES/DRESSINGS) ×3 IMPLANT
TIP APPLICATOR SPRAY EXTEND 16 (VASCULAR PRODUCTS) IMPLANT
TOWEL GREEN STERILE (TOWEL DISPOSABLE) ×3 IMPLANT
TOWEL GREEN STERILE FF (TOWEL DISPOSABLE) ×3 IMPLANT
TRAY FOLEY MTR SLVR 16FR STAT (SET/KITS/TRAYS/PACK) ×2 IMPLANT
TROCAR XCEL BLADELESS 5X75MML (TROCAR) ×3 IMPLANT
TROCAR XCEL NON-BLD 5MMX100MML (ENDOMECHANICALS) IMPLANT
WATER STERILE IRR 1000ML POUR (IV SOLUTION) ×6 IMPLANT

## 2019-01-13 NOTE — Anesthesia Procedure Notes (Signed)
Procedure Name: Intubation Date/Time: 01/13/2019 1:16 PM Performed by: Orlie Dakin, CRNA Pre-anesthesia Checklist: Patient identified, Emergency Drugs available, Suction available and Patient being monitored Patient Re-evaluated:Patient Re-evaluated prior to induction Oxygen Delivery Method: Circle system utilized Preoxygenation: Pre-oxygenation with 100% oxygen Induction Type: IV induction and Rapid sequence Laryngoscope Size: Mac and 4 Grade View: Grade II Tube type: Oral Endobronchial tube: Left and Double lumen EBT and 37 Fr Number of attempts: 1 Airway Equipment and Method: Stylet Placement Confirmation: ETT inserted through vocal cords under direct vision,  positive ETCO2 and breath sounds checked- equal and bilateral Secured at: 29 cm Tube secured with: Tape Dental Injury: Teeth and Oropharynx as per pre-operative assessment  Comments: Viva-sight with camera double lumen ETT placed.  RSI due to Covid-19 pandemic concerns.  EBT position confirmed with camera on Viva-sight.

## 2019-01-13 NOTE — Brief Op Note (Addendum)
01/13/2019  3:54 PM  PATIENT:  Xavier Howell  33 y.o. male  PRE-OPERATIVE DIAGNOSIS:  1. RECURRENT LEFT PTX WITH AIR LEAK  POST-OPERATIVE DIAGNOSIS: 1. RECURRENT LEFT PTX WITH AIR LEAK  PROCEDURE:  LEFT VIDEO ASSISTED THORACOSCOPY, STAPLING OF BLEBS (Left), PLEURAL ABRASION, INTERCOSTAL NERVE BLOCK  SURGEON:  Surgeon(s) and Role:    Melrose Nakayama, MD - Primary  PHYSICIAN ASSISTANT: Lars Pinks PA-C  ANESTHESIA:   general  EBL:  Per anesthesia record  BLOOD ADMINISTERED:none  DRAINS: 67 French chest tube placed in the left pleural space   LOCAL MEDICATIONS USED:  EXPAREL  SPECIMEN:  Source of Specimen:  LUL apex, LLL base, and wedge LLL superior segement  DISPOSITION OF SPECIMEN:  PATHOLOGY  COUNTS CORRECT:  YES  DICTATION: .Dragon Dictation  PLAN OF CARE: Admit to inpatient   PATIENT DISPOSITION:  PACU - hemodynamically stable.   Delay start of Pharmacological VTE agent (>24hrs) due to surgical blood loss or risk of bleeding: no

## 2019-01-13 NOTE — Plan of Care (Signed)
Patient progressing toward all care goals.  VATS completed today per Dr. Roxan Hockey and CT to left lateral chest wall was placed; now to -20cm sxn.  Patient is w/o respiratory complications or distress.  PCA in use post-op.  Increasing activity and diet as tolerated.  Will continue to monitor.

## 2019-01-13 NOTE — Interval H&P Note (Signed)
History and Physical Interval Note:  01/13/2019 12:16 PM  Xavier Howell  has presented today for surgery, with the diagnosis of LEFT PTX.  The various methods of treatment have been discussed with the patient and family. After consideration of risks, benefits and other options for treatment, the patient has consented to  Procedure(s): VIDEO ASSISTED THORACOSCOPY (Left) STAPLING OF BLEBS (Left) as a surgical intervention.  The patient's history has been reviewed, patient examined, no change in status, stable for surgery.  I have reviewed the patient's chart and labs.  Questions were answered to the patient's satisfaction.     Melrose Nakayama

## 2019-01-13 NOTE — Anesthesia Procedure Notes (Signed)
Arterial Line Insertion Start/End4/29/2020 12:49 PM Performed by: Kathryne Hitch, CRNA, CRNA  Patient location: Pre-op. Preanesthetic checklist: patient identified, IV checked, site marked, risks and benefits discussed, surgical consent, monitors and equipment checked, pre-op evaluation and timeout performed Lidocaine 1% used for infiltration Left, radial was placed Catheter size: 20 G Hand hygiene performed  and maximum sterile barriers used   Attempts: 1 Procedure performed without using ultrasound guided technique. Following insertion, dressing applied and Biopatch. Post procedure assessment: normal  Patient tolerated the procedure well with no immediate complications.

## 2019-01-13 NOTE — Anesthesia Preprocedure Evaluation (Addendum)
Anesthesia Evaluation  Patient identified by MRN, date of birth, ID band Patient awake    Reviewed: Allergy & Precautions, NPO status , Patient's Chart, lab work & pertinent test results  History of Anesthesia Complications Negative for: history of anesthetic complications  Airway Mallampati: II  TM Distance: >3 FB Neck ROM: Full    Dental  (+) Dental Advisory Given, Teeth Intact   Pulmonary Current Smoker,   Recurrent left-sided pneumothorax    breath sounds clear to auscultation       Cardiovascular negative cardio ROS   Rhythm:Regular Rate:Normal     Neuro/Psych PSYCHIATRIC DISORDERS Anxiety Depression negative neurological ROS     GI/Hepatic negative GI ROS, (+)     substance abuse  marijuana use,   Endo/Other  negative endocrine ROS  Renal/GU  Absent left kidney      Musculoskeletal negative musculoskeletal ROS (+)   Abdominal   Peds  Hematology negative hematology ROS (+)   Anesthesia Other Findings   Reproductive/Obstetrics                            Anesthesia Physical Anesthesia Plan  ASA: II  Anesthesia Plan: General   Post-op Pain Management:    Induction: Intravenous  PONV Risk Score and Plan: 3 and Treatment may vary due to age or medical condition, Ondansetron, Dexamethasone and Midazolam  Airway Management Planned: Double Lumen EBT  Additional Equipment: Arterial line  Intra-op Plan:   Post-operative Plan: Possible Post-op intubation/ventilation  Informed Consent: I have reviewed the patients History and Physical, chart, labs and discussed the procedure including the risks, benefits and alternatives for the proposed anesthesia with the patient or authorized representative who has indicated his/her understanding and acceptance.     Dental advisory given  Plan Discussed with: CRNA and Anesthesiologist  Anesthesia Plan Comments: (2 large bore IV's)         Anesthesia Quick Evaluation

## 2019-01-13 NOTE — Transfer of Care (Signed)
Immediate Anesthesia Transfer of Care Note  Patient: Xavier Howell  Procedure(s) Performed: VIDEO ASSISTED THORACOSCOPY (Left Chest) STAPLING OF BLEBS (Left )  Patient Location: PACU  Anesthesia Type:General  Level of Consciousness: awake, alert  and oriented  Airway & Oxygen Therapy: Patient Spontanous Breathing and Patient connected to face mask oxygen  Post-op Assessment: Report given to RN and Post -op Vital signs reviewed and stable  Post vital signs: Reviewed and stable  Last Vitals:  Vitals Value Taken Time  BP 152/102 01/13/2019  3:27 PM  Temp    Pulse 72 01/13/2019  3:30 PM  Resp 17 01/13/2019  3:30 PM  SpO2 100 % 01/13/2019  3:30 PM  Vitals shown include unvalidated device data.  Last Pain:  Vitals:   01/13/19 1111  TempSrc:   PainSc: 0-No pain         Complications: No apparent anesthesia complications

## 2019-01-13 NOTE — Progress Notes (Signed)
Pt called nurse to room requesting to check chest tube.  Pt stated he pulled the tube going to the bathroom.  Noted tape on lower abdomen torn.  Re-secured tubing and pt reports that it feels better.  Pt then stated he feels like there is a leak stating he was SOB and felt pressure.  No leak noted in chambers.  Tape applied to tube connections and pt now reports SOB/pressure  resolved.  O2 sats 96% @ 2LviaNC

## 2019-01-13 NOTE — Anesthesia Postprocedure Evaluation (Signed)
Anesthesia Post Note  Patient: Xavier Howell  Procedure(s) Performed: VIDEO ASSISTED THORACOSCOPY (Left Chest) STAPLING OF BLEBS (Left )     Patient location during evaluation: PACU Anesthesia Type: General Level of consciousness: awake and alert Pain management: pain level controlled Vital Signs Assessment: post-procedure vital signs reviewed and stable Respiratory status: spontaneous breathing, nonlabored ventilation and respiratory function stable Cardiovascular status: blood pressure returned to baseline and stable Postop Assessment: no apparent nausea or vomiting Anesthetic complications: no    Last Vitals:  Vitals:   01/13/19 1635 01/13/19 1650  BP: (!) 144/90 (!) 155/100  Pulse: 63 65  Resp: 13 12  Temp: (!) 36.2 C   SpO2: 100% 98%    Last Pain:  Vitals:   01/13/19 1625  TempSrc:   PainSc: [name]

## 2019-01-14 ENCOUNTER — Encounter (HOSPITAL_COMMUNITY): Payer: Self-pay | Admitting: Thoracic Surgery (Cardiothoracic Vascular Surgery)

## 2019-01-14 ENCOUNTER — Inpatient Hospital Stay (HOSPITAL_COMMUNITY): Payer: 59

## 2019-01-14 LAB — CBC
HCT: 35.2 % — ABNORMAL LOW (ref 39.0–52.0)
Hemoglobin: 12.3 g/dL — ABNORMAL LOW (ref 13.0–17.0)
MCH: 32.7 pg (ref 26.0–34.0)
MCHC: 34.9 g/dL (ref 30.0–36.0)
MCV: 93.6 fL (ref 80.0–100.0)
Platelets: 184 10*3/uL (ref 150–400)
RBC: 3.76 MIL/uL — ABNORMAL LOW (ref 4.22–5.81)
RDW: 11.8 % (ref 11.5–15.5)
WBC: 13 10*3/uL — ABNORMAL HIGH (ref 4.0–10.5)
nRBC: 0 % (ref 0.0–0.2)

## 2019-01-14 LAB — BASIC METABOLIC PANEL
Anion gap: 10 (ref 5–15)
BUN: 9 mg/dL (ref 6–20)
CO2: 22 mmol/L (ref 22–32)
Calcium: 8.8 mg/dL — ABNORMAL LOW (ref 8.9–10.3)
Chloride: 101 mmol/L (ref 98–111)
Creatinine, Ser: 0.95 mg/dL (ref 0.61–1.24)
GFR calc Af Amer: >60 mL/min (ref >60)
GFR calc non Af Amer: >60 mL/min (ref >60)
Glucose, Bld: 148 mg/dL — ABNORMAL HIGH (ref 70–99)
Potassium: 4.1 mmol/L (ref 3.5–5.1)
Sodium: 133 mmol/L — ABNORMAL LOW (ref 135–145)

## 2019-01-14 LAB — GLUCOSE, CAPILLARY: Glucose-Capillary: 91 mg/dL (ref 70–99)

## 2019-01-14 MED ORDER — SODIUM CHLORIDE 0.45 % IV SOLN
INTRAVENOUS | Status: DC
  Administered 2019-01-14: 10 mL/h via INTRAVENOUS

## 2019-01-14 MED ORDER — IBUPROFEN 200 MG PO TABS
400.0000 mg | ORAL_TABLET | Freq: Four times a day (QID) | ORAL | Status: DC | PRN
  Administered 2019-01-15: 400 mg via ORAL
  Filled 2019-01-14: qty 2

## 2019-01-14 NOTE — Progress Notes (Addendum)
      [address]       [name] [address]             [phone]      1 Day Post-Op Procedure(s) (LRB): VIDEO ASSISTED THORACOSCOPY (Left) STAPLING OF BLEBS (Left)   Subjective:  Doing okay.  Does have some mild pain at chest tube site.  Hoping he can eat some food today.  States he hasn't eaten much since admission.   Objective: Vital signs in last 24 hours: Temp:  [97.2 F (36.2 C)-98.7 F (37.1 C)] 98.7 F (37.1 C) (04/30 0400) Pulse Rate:  [50-81] 81 (04/30 0400) Cardiac Rhythm: Sinus tachycardia (04/30 0700) Resp:  [11-26] 13 (04/30 0416) BP: (127-155)/(74-100) 141/74 (04/30 0400) SpO2:  [93 %-100 %] 100 % (04/30 0416)  Intake/Output from previous day: 04/29 0701 - 04/30 0700 In: 2985.9 [P.O.:240; I.V.:2445.8; IV Piggyback:300.1] Out: 2500 [Urine:1950; Blood:50; Chest Tube:500]  General appearance: alert, cooperative and no distress Heart: regular rate and rhythm Lungs: clear to auscultation bilaterally Abdomen: soft, non-tender; bowel sounds normal; no masses,  no organomegaly Extremities: extremities normal, atraumatic, no cyanosis or edema Wound: clean and dry  Lab Results: Recent Labs    01/12/19 2025 01/14/19 0241  WBC 5.5 13.0*  HGB 14.1 12.3*  HCT 40.6 35.2*  PLT 200 184   BMET:  Recent Labs    01/12/19 2025 01/14/19 0241  NA 136 133*  K 3.6 4.1  CL 101 101  CO2 25 22  GLUCOSE 91 148*  BUN 11 9  CREATININE 1.09 0.95  CALCIUM 9.1 8.8*    PT/INR:  Recent Labs    01/12/19 2025  LABPROT 14.3  INR 1.1   ABG    Component Value Date/Time   PHART 7.410 01/12/2019 1920   HCO3 27.5 01/12/2019 1920   O2SAT 96.9 01/12/2019 1920   CBG (last 3)  Recent Labs    01/13/19 1854 01/13/19 2335 01/14/19 0600  GLUCAP 101* 112* 91    Assessment/Plan: S/P Procedure(s) (LRB): VIDEO ASSISTED THORACOSCOPY (Left) STAPLING OF BLEBS (Left)  1. Chest tube- minimal output since surgery, no air leak present, CXR shows trace  apical pneumothorax vs. Space... will place chest tube to water seal 2. CV- NSR with brief pauses, not on any nodal blocking agents, monitor 3. Renal- patient only has 1 kidney, will d/c Toradol, patient states he would like ibuprofen which he takes at home, will order low dose q4 prn 4. Lovenox for DVT prophylaxis 5. IV fluids to KVO 6. D/C Foley 7. Dispo- patient stable, chest tube to water seal, repeat CXR in AM, NSR with 2 episodes of brief pauses, not taking nodal blocking agents, will monitor, continue current care   LOS: 3 days    [name] 01/14/2019 Patient seen and examined, agree with above  Remo Lipps C. Roxan Hockey, MD [facility] [phone]

## 2019-01-14 NOTE — Progress Notes (Signed)
Page sent to Dr. Roxy Manns with  [facility] to inform him that patient had 2 pauses during night. One lasting 2.88 seconds and the other lasting for 4 seconds. Did not receive call back.

## 2019-01-14 NOTE — Op Note (Signed)
NAMEDARIN, ARNDT MEDICAL RECORD [id number] ACCOUNT [id number] DATE OF BIRTH:1985-11-13 FACILITY: [facility] LOCATION: [facility] PHYSICIAN:Lenorris Karger Chaya Jan, MD  OPERATIVE REPORT  DATE OF PROCEDURE:  01/13/2019  PREOPERATIVE DIAGNOSIS:  Recurrent left spontaneous pneumothorax.  POSTOPERATIVE DIAGNOSIS:  Recurrent left spontaneous pneumothorax.  PROCEDURE:   Left video-assisted thoracoscopic surgery Stapling of blebs from upper and lower lobe, Pleural abrasion, and Intercostal nerve blocks levels 3-9.  SURGEON:  Modesto Charon, MD  ASSISTANT:  Lars Pinks, PA-C  ANESTHESIA:  General.  FINDINGS:  No definitive air leak was identified.  Small blebs at apex and superior segment were removed. Atelectasis in posterior basilar portion of lower lobe.  No air leak demonstrable at completion of procedure.  CLINICAL NOTE:  Mr. Schlabach is a 33 year old gentleman who presented with a recurrent left spontaneous pneumothorax.  A pigtail catheter was placed in the emergency room with reexpansion of the lung.  Given that this was a recurrence, it was recommended  that he undergo VATS for definitive treatment.  The indications, risks, benefits, and alternatives were discussed in detail with the patient.  He understood and accepted the risks and agreed to proceed.  OPERATIVE NOTE:  Mr. Dollins was brought to the preoperative holding area on 01/13/2019.  Anesthesia placed an arterial blood pressure monitor line and a central venous line.  He was taken to the operating room, anesthetized and intubated with a  double-lumen endotracheal tube.  Intravenous antibiotics were administered.  Sequential compression devices were placed on the calves for DVT prophylaxis.  He was placed in a right lateral decubitus position.  Single-lung ventilation of the right lung  was initiated and was tolerated well throughout the procedure.  The chest tube was removed from the left side, and the left chest  was prepped and draped in the usual sterile fashion.  A timeout was performed.  A solution containing 20 mL of liposomal bupivacaine, 30 mL of 0.5% bupivacaine, and 50 mL of saline was prepared.  This was used for local anesthesia at the incision sites as well as for the intercostal nerve blocks.  An incision  was made in the 7th interspace in the midaxillary line.  This was a small port incision.  A 5 mm port was inserted.  The thoracoscope was advanced into the chest.  There was good isolation of the left lung.  The lung was completely deflated.  There was  a small amount of blood and clot within the chest.  An area in the 4th interspace anterolaterally was anesthetized with the bupivacaine solution and then an incision was made.  This was a 4 cm working incision.  No rib spreading was performed during the  procedure.  The lung was inspected.  There was no obvious ruptured bleb.  There did appear to be some scarring and some possible blebs at the apex, and the apex was removed with sequential firings of an Echelon 45 mm stapler using gold cartridges.  The  superior segment of the lower lobe was also inspected.  This also came to a very sharp point, and there were some small blebs along the edge of that, but those did not appear ruptured.  The tip of the superior segment was also removed with sequential  firings of the Echelon stapler.  The chest was filled with warm saline, and on multiple occasions, the left lung was reinflated.  There was no apparent air leak present with positive pressure ventilation.  The diaphragmatic surface of the lower lobe and  posterior aspect of  the basilar segment of the lower lobe were inspected.  There was some atelectasis and some scar tissue in this area.  There was an area that appeared relatively thin but was not a definitive air leak.  This area was stapled out as  well.  The entire parietal pleural surface, including the diaphragm, was lightly abraded to promote  adhesion formation.  The chest was copiously irrigated with saline.  A test inflation to 30 cm of water revealed no air leakage.  It should be noted that  while inspecting for an air leak, a separate 5 mm incision was made in the 9th interspace laterally to allow the camera to be placed lower to get a better view of the basilar portion of the lower lobe, but again, no air leak was visible.  A 28-French  chest tube was placed through the original port incision, and dual-lung ventilation was resumed.  The secondary port incision was closed with a 3-0 Vicryl subcuticular suture.  The working incision was closed in 3 layers.  The chest tube was placed to  suction.  The patient was placed back in a supine position.  He was then extubated in the operating room and taken to the postanesthetic care unit in good condition.  Of note, there was no air leak at the completion of the procedure.  LN/NUANCE  D:01/13/2019 T:01/14/2019 JOB:[id number]

## 2019-01-15 ENCOUNTER — Inpatient Hospital Stay (HOSPITAL_COMMUNITY): Payer: 59

## 2019-01-15 LAB — CBC
HCT: 34.6 % — ABNORMAL LOW (ref 39.0–52.0)
Hemoglobin: 12.1 g/dL — ABNORMAL LOW (ref 13.0–17.0)
MCH: 32.5 pg (ref 26.0–34.0)
MCHC: 35 g/dL (ref 30.0–36.0)
MCV: 93 fL (ref 80.0–100.0)
Platelets: 187 10*3/uL (ref 150–400)
RBC: 3.72 MIL/uL — ABNORMAL LOW (ref 4.22–5.81)
RDW: 11.9 % (ref 11.5–15.5)
WBC: 7.9 10*3/uL (ref 4.0–10.5)
nRBC: 0 % (ref 0.0–0.2)

## 2019-01-15 LAB — COMPREHENSIVE METABOLIC PANEL
ALT: 12 U/L (ref 0–44)
AST: 20 U/L (ref 15–41)
Albumin: 3.4 g/dL — ABNORMAL LOW (ref 3.5–5.0)
Alkaline Phosphatase: 42 U/L (ref 38–126)
Anion gap: 10 (ref 5–15)
BUN: 10 mg/dL (ref 6–20)
CO2: 23 mmol/L (ref 22–32)
Calcium: 9 mg/dL (ref 8.9–10.3)
Chloride: 104 mmol/L (ref 98–111)
Creatinine, Ser: 0.96 mg/dL (ref 0.61–1.24)
GFR calc Af Amer: >60 mL/min (ref >60)
GFR calc non Af Amer: >60 mL/min (ref >60)
Glucose, Bld: 98 mg/dL (ref 70–99)
Potassium: 3.7 mmol/L (ref 3.5–5.1)
Sodium: 137 mmol/L (ref 135–145)
Total Bilirubin: 0.9 mg/dL (ref 0.3–1.2)
Total Protein: 6.2 g/dL — ABNORMAL LOW (ref 6.5–8.1)

## 2019-01-15 NOTE — Progress Notes (Signed)
PCA pump d/c and removed from patient's room.  Discarded 11 cc's of Dilaudid in the stericycle with witness, Tyrell Antonio.

## 2019-01-15 NOTE — Progress Notes (Addendum)
      Xavier Howell       [name], [address]             [phone]      2 Days Post-Op Procedure(s) (LRB): VIDEO ASSISTED THORACOSCOPY (Left) STAPLING OF BLEBS (Left) Subjective: Feels good this morning. A little nervous about the chest tube removal  Objective: Vital signs in last 24 hours: Temp:  [98 F (36.7 C)-98.8 F (37.1 C)] 98 F (36.7 C) (05/01 0746) Pulse Rate:  [63-66] 63 (05/01 0308) Cardiac Rhythm: Normal sinus rhythm (05/01 0756) Resp:  [13-19] 18 (05/01 0415) BP: (124-146)/(82-93) 141/91 (05/01 0746) SpO2:  [34 %-100 %] 100 % (05/01 0415)     Intake/Output from previous day: 04/30 0701 - 05/01 0700 In: 1155.7 [P.O.:960; I.V.:195.7] Out: 2250 [Urine:2200; Chest Tube:50] Intake/Output this shift: Total I/O In: 120 [P.O.:120] Out: -   General appearance: alert, cooperative and no distress Heart: regular rate and rhythm, S1, S2 normal, no murmur, click, rub or gallop Lungs: clear to auscultation bilaterally Abdomen: soft, non-tender; bowel sounds normal; no masses,  no organomegaly Extremities: extremities normal, atraumatic, no cyanosis or edema Wound: clean and dry  Lab Results: Recent Labs    01/14/19 0241 01/15/19 0224  WBC 13.0* 7.9  HGB 12.3* 12.1*  HCT 35.2* 34.6*  PLT 184 187   BMET:  Recent Labs    01/14/19 0241 01/15/19 0224  NA 133* 137  K 4.1 3.7  CL 101 104  CO2 22 23  GLUCOSE 148* 98  BUN 9 10  CREATININE 0.95 0.96  CALCIUM 8.8* 9.0    PT/INR:  Recent Labs    01/12/19 2025  LABPROT 14.3  INR 1.1   ABG    Component Value Date/Time   PHART 7.410 01/12/2019 1920   HCO3 27.5 01/12/2019 1920   O2SAT 96.9 01/12/2019 1920   CBG (last 3)  Recent Labs    01/13/19 1854 01/13/19 2335 01/14/19 0600  GLUCAP 101* 112* 91    Assessment/Plan: S/P Procedure(s) (LRB): VIDEO ASSISTED THORACOSCOPY (Left) STAPLING OF BLEBS (Left)  1. Chest tube- 50cc out over 24 hours. CXR shows stable position of  left-sided chest tube with minimal left apical pneumothorax. Chest tube is on water seal.  2. CV- NSR in the 60s, BP well controlled. Due to brief pauses yesterday not on a nodal blocking agent 3. Pulm- on room air with excellent oxygen saturation. Continue to encourage incentive spirometer.  4. Lovenox for DVT prophylaxis 5. Pain well controlled with PCA. Ibuprofen available. 6. Continue to encourage smoking cessation-on a nicotine patch.   Plan: Likely can remove chest tube today. No air leak on exam and CXR has been stable. Will order a follow-up CXR for the morning. Discontinue PCA once chest tube has been pulled.    LOS: 4 days    Xavier Howell 01/15/2019 Patient seen and examined, agree with above Dc chest tube Probably home in AM  Xavier C. Roxan Hockey, MD [facility] [phone]

## 2019-01-16 ENCOUNTER — Inpatient Hospital Stay (HOSPITAL_COMMUNITY): Payer: 59

## 2019-01-16 MED ORDER — IBUPROFEN 400 MG PO TABS: 400.0000 mg | ORAL_TABLET | Freq: Four times a day (QID) | ORAL | 0 refills | Status: AC | PRN

## 2019-01-16 MED ORDER — OXYCODONE HCL 5 MG PO TABS: 5.0000 mg | ORAL_TABLET | ORAL | 0 refills | Status: DC | PRN

## 2019-01-16 NOTE — Progress Notes (Signed)
All discharge instructions reviewed with patient and with wife who was on the phone. No questions or concerns at this time.

## 2019-01-16 NOTE — Progress Notes (Signed)
      [address]       [place] [address]             [phone]      3 Days Post-Op Procedure(s) (LRB): VIDEO ASSISTED THORACOSCOPY (Left) STAPLING OF BLEBS (Left)   Subjective:  No new complaints. Feels good and is ready to go home.  Objective: Vital signs in last 24 hours: Temp:  [98.6 F (37 C)-99.2 F (37.3 C)] 98.7 F (37.1 C) (05/02 0400) Pulse Rate:  [59-66] 66 (05/02 0400) Cardiac Rhythm: Normal sinus rhythm (05/02 0400) Resp:  [15-20] 20 (05/01 1928) BP: (112-132)/(61-83) 127/73 (05/02 0400) SpO2:  [98 %-99 %] 98 % (05/02 0400)  Intake/Output from previous day: 05/01 0701 - 05/02 0700 In: 782 [P.O.:782] Out: 2 [Urine:1; Stool:1]  General appearance: alert, cooperative and no distress Heart: regular rate and rhythm Lungs: clear to auscultation bilaterally Abdomen: soft, non-tender; bowel sounds normal; no masses,  no organomegaly Wound: clean and dry  Lab Results: Recent Labs    01/14/19 0241 01/15/19 0224  WBC 13.0* 7.9  HGB 12.3* 12.1*  HCT 35.2* 34.6*  PLT 184 187   BMET:  Recent Labs    01/14/19 0241 01/15/19 0224  NA 133* 137  K 4.1 3.7  CL 101 104  CO2 22 23  GLUCOSE 148* 98  BUN 9 10  CREATININE 0.95 0.96  CALCIUM 8.8* 9.0    PT/INR: No results for input(s): LABPROT, INR in the last 72 hours. ABG    Component Value Date/Time   PHART 7.410 01/12/2019 1920   HCO3 27.5 01/12/2019 1920   O2SAT 96.9 01/12/2019 1920   CBG (last 3)  Recent Labs    01/13/19 1854 01/13/19 2335 01/14/19 0600  GLUCAP 101* 112* 91    Assessment/Plan: S/P Procedure(s) (LRB): VIDEO ASSISTED THORACOSCOPY (Left) STAPLING OF BLEBS (Left)  1. CV- hemodynamically stable 2. Pulm- CXR remains stable with left apical space 3. Dispo- patient stable,will d/c home today.   LOS: 5 days    [name] 01/16/2019

## 2019-01-16 NOTE — Discharge Instructions (Signed)
Discharge Instructions:  1. You may shower, please wash incisions daily with soap and water and keep dry.  If you wish to cover wounds with dressing you may do so but please keep clean and change daily.  No tub baths or swimming until incisions have completely healed.  If your incisions become red or develop any drainage please call our office at [phone]  2. No Driving until cleared by Dr. Gomez Cleverly and you are no longer using narcotic pain medications  3. Fever of 101.5 for at least 24 hours with no source, please contact our office at [phone]  4. Activity- up as tolerated, please walk at least 3 times per day.  Avoid strenuous activity for 2-3 weeks  5. If any questions or concerns arise, please do not hesitate to contact our office at [phone]

## 2019-01-31 ENCOUNTER — Ambulatory Visit (HOSPITAL_COMMUNITY)
Admission: EM | Admit: 2019-01-31 | Discharge: 2019-01-31 | Disposition: A | Discharge status: Home / Self Care | Payer: No Typology Code available for payment source | Attending: Emergency Medicine | Admitting: Emergency Medicine

## 2019-01-31 ENCOUNTER — Other Ambulatory Visit: Payer: Self-pay

## 2019-01-31 ENCOUNTER — Encounter (HOSPITAL_COMMUNITY): Payer: Self-pay

## 2019-01-31 DIAGNOSIS — L0291 Cutaneous abscess, unspecified: Secondary | ICD-10-CM

## 2019-01-31 DIAGNOSIS — L02213 Cutaneous abscess of chest wall: Secondary | ICD-10-CM

## 2019-01-31 MED ORDER — SULFAMETHOXAZOLE-TRIMETHOPRIM 800-160 MG PO TABS: 1.0000 | ORAL_TABLET | Freq: Two times a day (BID) | ORAL | 0 refills | Status: AC

## 2019-01-31 MED ORDER — CEPHALEXIN 500 MG PO CAPS: 500.0000 mg | ORAL_CAPSULE | Freq: Four times a day (QID) | ORAL | 0 refills | Status: AC

## 2019-01-31 NOTE — ED Provider Notes (Signed)
[facility]    CSN: [id number] Arrival date & time: 01/31/19  1234     History   Chief Complaint Chief Complaint  Patient presents with  . Abscess    HPI Xavier Howell is a 33 y.o. male.   [name] presents with complaints of abscess to right chest wall. Noticed it 2-3 days ago, started out as small red raised. He applied heat and squeezed it, small discharge and then it increased in size. It is painful. Has been able to squeeze small amount of discharge from it. Denies any previous similar. No fevers.  No known MRSA history. Tender. Spontaneous pneumothorax with surgery 5/1. Has follow up tomorrow. No complaints with breathing at this time. 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     ROS per HPI, negative if not otherwise mentioned.      Past Medical History:  Diagnosis Date  . Absent kidney    left kidney     Patient Active Problem List   Diagnosis Date Noted  . Recurrent spontaneous pneumothorax 01/13/2019  . Spontaneous pneumothorax 01/11/2019  . PTSD (post-traumatic stress disorder) 02/19/2017  . Cannabis use disorder, moderate, dependence ([place]) 02/19/2017  . Severe recurrent major depressive disorder with psychotic features ([place]) 02/18/2017  . Pneumothorax on left 03/02/2016    Past Surgical History:  Procedure Laterality Date  . CHEST TUBE INSERTION  01/11/2019   left chest   . STAPLING OF BLEBS Left 01/13/2019   Procedure: STAPLING OF BLEBS;  Surgeon: Melrose Nakayama, MD;  Location: [facility];  Service: Thoracic;  Laterality: Left;  Marland Kitchen VIDEO ASSISTED THORACOSCOPY Left 01/13/2019   Procedure: VIDEO ASSISTED THORACOSCOPY;  Surgeon: Melrose Nakayama, MD;  Location: [facility] OR;  Service: Thoracic;  Laterality: Left;       Home Medications    Prior to Admission medications   Medication Sig Start Date End Date Taking? Authorizing Provider  acetaminophen (TYLENOL) 325 MG tablet Take 2 tablets (650 mg  total) by mouth every 6 (six) hours as needed for moderate pain. 02/18/17   Lindell Spar I, NP  cephALEXin (KEFLEX) 500 MG capsule Take 1 capsule (500 mg total) by mouth 4 (four) times daily for 7 days. 01/31/19 02/07/19  Zigmund Gottron, NP  FLUoxetine (PROZAC) 20 MG capsule Take 1 capsule (20 mg total) by mouth daily. For depression Patient not taking: Reported on 01/11/2019 02/22/17   Lindell Spar I, NP  hydrOXYzine (ATARAX/VISTARIL) 25 MG tablet Take 1 tablet (25 mg total) by mouth 3 (three) times daily as needed for anxiety. 02/21/17   Lindell Spar I, NP  ibuprofen (ADVIL) 400 MG tablet Take 1 tablet (400 mg total) by mouth every 6 (six) hours as needed for moderate pain. 01/16/19   Barrett, Erin R, PA-C  nicotine (NICODERM CQ - DOSED IN MG/24 HOURS) 21 mg/24hr patch Place 1 patch (21 mg total) onto the skin daily. For smoking cessation Patient taking differently: Place 21 mg onto the skin daily as needed (For smoking cessation).  02/21/17   Lindell Spar I, NP  OLANZapine (ZYPREXA) 7.5 MG tablet Take 1 tablet (7.5 mg total) by mouth at bedtime. For mood control Patient not taking: Reported on 01/11/2019 02/21/17   Lindell Spar I, NP  oxyCODONE (OXY IR/ROXICODONE) 5 MG immediate release tablet Take 1-2 tablets (5-10 mg total) by mouth every 4 (four) hours as needed for severe pain. 01/16/19   Barrett, Erin R, PA-C  sulfamethoxazole-trimethoprim (BACTRIM DS) 800-160 MG tablet Take 1 tablet  by mouth 2 (two) times daily for 7 days. 01/31/19 02/07/19  Zigmund Gottron, NP  traZODone (DESYREL) 50 MG tablet Take 1 tablet (50 mg) Q bedtime: For sleep Patient not taking: Reported on 01/11/2019 02/21/17   Encarnacion Slates, NP    Family History Family History  Problem Relation Age of Onset  . Alcoholism Mother   . Cirrhosis Mother   . Cancer Sister   . Cirrhosis Brother   . Lupus Other     Social History Social History   Tobacco Use  . Smoking status: Current Every Day Smoker    Packs/day: 0.50    Types:  Cigarettes  . Smokeless tobacco: Never Used  Substance Use Topics  . Alcohol use: No    Comment: socially  . Drug use: No     Allergies   Patient has no known allergies.   Review of Systems Review of Systems   Physical Exam Triage Vital Signs ED Triage Vitals  Enc Vitals Group     BP 01/31/19 1315 124/80     Pulse Rate 01/31/19 1315 65     Resp 01/31/19 1315 12     Temp 01/31/19 1315 98.3 F (36.8 C)     Temp Source 01/31/19 1315 Oral     SpO2 01/31/19 1315 100 %     Weight 01/31/19 1334 137 lb (62.1 kg)     Height --      Head Circumference --      Peak Flow --      Pain Score 01/31/19 1345 0     Pain Loc --      Pain Edu? --      Excl. in [place]? --    No data found.  Updated Vital Signs BP 124/80 (BP Location: Left Arm)   Pulse 65   Temp 98.3 F (36.8 C) (Oral)   Resp 12   Wt 137 lb (62.1 kg)   SpO2 100%   BMI 21.46 kg/m   Visual Acuity Right Eye Distance:   Left Eye Distance:   Bilateral Distance:    Right Eye Near:   Left Eye Near:    Bilateral Near:     Physical Exam Constitutional:      Appearance: He is well-developed.  Cardiovascular:     Rate and Rhythm: Normal rate and regular rhythm.  Pulmonary:     Effort: Pulmonary effort is normal.     Breath sounds: Normal breath sounds.  Skin:    General: Skin is warm and dry.          Comments: Approximately 1.5 cm firm red, open abscess, no active drainage, mild surrounding redness, minimal fluctuance, tender  Neurological:     Mental Status: He is alert and oriented to person, place, and time.        UC Treatments / Results  Labs (all labs ordered are listed, but only abnormal results are displayed) Labs Reviewed - No data to display  EKG None  Radiology No results found.  Procedures Procedures (including critical care time)  Medications Ordered in UC Medications - No data to display  Initial Impression / Assessment and Plan / UC Course  I have reviewed the triage vital  signs and the nursing notes.  Pertinent labs & imaging results that were available during my care of the patient were reviewed by me and considered in my medical decision making (see chart for details).     Right chest wall abscess. Patient with quite significant tenderness,  would like to trial antibiotics prior to further I&D. Minimal fluctuance, firm. Warm compresses encouraged. To return if no improvement or if worsening in the next 48 hours as may need I&D. Patient verbalized understanding and agreeable to plan.    Final Clinical Impressions(s) / UC Diagnoses   Final diagnoses:  Abscess     Discharge Instructions     Complete course of antibiotics.  Probiotics may be helpful.  Warm compresses to promote drainage.  If no improvement or if worsening in the next 48 hours please return to be seen or go to the ER as you may need further drainage.    ED Prescriptions    Medication Sig Dispense Auth. Provider   cephALEXin (KEFLEX) 500 MG capsule Take 1 capsule (500 mg total) by mouth 4 (four) times daily for 7 days. 28 capsule Augusto Gamble B, NP   sulfamethoxazole-trimethoprim (BACTRIM DS) 800-160 MG tablet Take 1 tablet by mouth 2 (two) times daily for 7 days. 14 tablet Zigmund Gottron, NP     Controlled Substance Prescriptions [place] Controlled Substance Registry consulted? Not Applicable   Zigmund Gottron, NP 01/31/19 1446

## 2019-01-31 NOTE — ED Triage Notes (Signed)
Pt cc he has a abscess on his right side of his ribs. Pt has had lung surgery 2 weeks ago.

## 2019-01-31 NOTE — Discharge Instructions (Signed)
Complete course of antibiotics.  Probiotics may be helpful.  Warm compresses to promote drainage.  If no improvement or if worsening in the next 48 hours please return to be seen or go to the ER as you may need further drainage.

## 2019-02-01 ENCOUNTER — Ambulatory Visit: Payer: Self-pay

## 2019-02-02 ENCOUNTER — Ambulatory Visit: Payer: No Typology Code available for payment source

## 2019-02-03 ENCOUNTER — Other Ambulatory Visit: Payer: Self-pay

## 2019-02-03 ENCOUNTER — Other Ambulatory Visit: Payer: Self-pay | Admitting: Thoracic Surgery (Cardiothoracic Vascular Surgery)

## 2019-02-03 DIAGNOSIS — J939 Pneumothorax, unspecified: Secondary | ICD-10-CM

## 2019-02-04 ENCOUNTER — Ambulatory Visit
Admission: RE | Admit: 2019-02-04 | Discharge: 2019-02-04 | Disposition: A | Discharge status: Home / Self Care | Payer: No Typology Code available for payment source | Source: Ambulatory Visit | Attending: Thoracic Surgery (Cardiothoracic Vascular Surgery) | Admitting: Thoracic Surgery (Cardiothoracic Vascular Surgery)

## 2019-02-04 ENCOUNTER — Ambulatory Visit (INDEPENDENT_AMBULATORY_CARE_PROVIDER_SITE_OTHER): Payer: Self-pay | Admitting: Physician Assistant

## 2019-02-04 ENCOUNTER — Encounter: Payer: Self-pay | Admitting: Physician Assistant

## 2019-02-04 VITALS — BP 120/79 | Temp 98.8°F | Resp 16 | Ht 67.0 in | Wt 137.0 lb

## 2019-02-04 DIAGNOSIS — J939 Pneumothorax, unspecified: Secondary | ICD-10-CM

## 2019-02-04 DIAGNOSIS — Z09 Encounter for follow-up examination after completed treatment for conditions other than malignant neoplasm: Secondary | ICD-10-CM

## 2019-02-04 MED ORDER — OXYCODONE HCL 5 MG PO TABS: 5.0000 mg | ORAL_TABLET | ORAL | 0 refills | Status: DC | PRN

## 2019-02-04 MED ORDER — HYDROXYZINE HCL 25 MG PO TABS: 25.0000 mg | ORAL_TABLET | Freq: Three times a day (TID) | ORAL | 0 refills | Status: AC | PRN

## 2019-02-04 NOTE — Patient Instructions (Addendum)
No strenuous activity for at least 2 more weeks.   Do not drive if taking Oxycodone

## 2019-02-04 NOTE — Progress Notes (Signed)
HPI:  Patient returns for routine postoperative follow-up having undergone a left VATS, stapling of blebs, pleural abrasion, and intercostal nerve block ribs 3-9 for recurrent left pneumothorax on 01/13/2019. Since hospital discharge, the patient reports he still has some pain on left side at incision. He states his breathing has been very good.   Current Outpatient Medications  Medication Sig Dispense Refill  . acetaminophen (TYLENOL) 325 MG tablet Take 2 tablets (650 mg total) by mouth every 6 (six) hours as needed for moderate pain.    . cephALEXin (KEFLEX) 500 MG capsule Take 1 capsule (500 mg total) by mouth 4 (four) times daily for 7 days. 28 capsule 0  . FLUoxetine (PROZAC) 20 MG capsule Take 1 capsule (20 mg total) by mouth daily. For depression (Patient not taking: Reported on 01/11/2019) 30 capsule 0  . hydrOXYzine (ATARAX/VISTARIL) 25 MG tablet Take 1 tablet (25 mg total) by mouth 3 (three) times daily as needed for anxiety. 60 tablet 0  . ibuprofen (ADVIL) 400 MG tablet Take 1 tablet (400 mg total) by mouth every 6 (six) hours as needed for moderate pain. 30 tablet 0  . nicotine (NICODERM CQ - DOSED IN MG/24 HOURS) 21 mg/24hr patch Place 1 patch (21 mg total) onto the skin daily. For smoking cessation (Patient taking differently: Place 21 mg onto the skin daily as needed (For smoking cessation). ) 28 patch 0  . OLANZapine (ZYPREXA) 7.5 MG tablet Take 1 tablet (7.5 mg total) by mouth at bedtime. For mood control (Patient not taking: Reported on 01/11/2019) 30 tablet 0  . oxyCODONE (OXY IR/ROXICODONE) 5 MG immediate release tablet Take 1-2 tablets (5-10 mg total) by mouth every 4 (four) hours as needed for severe pain. 30 tablet 0  . sulfamethoxazole-trimethoprim (BACTRIM DS) 800-160 MG tablet Take 1 tablet by mouth 2 (two) times daily for 7 days. 14 tablet 0  . traZODone (DESYREL) 50 MG tablet Take 1 tablet (50 mg) Q bedtime: For sleep (Patient not taking: Reported on 01/11/2019) 30  tablet 0  Vital Signs: Afebrile, BP 120/79, RR 16, Oxygenation 98% on room air   Physical Exam: CV-RRR Pulmonary-Clear to auscultation bilaterally Wound-Clean and dry. Small remnant of suture removed as above skin edge  Diagnostic Test: CLINICAL DATA:  History of left-sided pneumothorax status post VATS and bleb stapling.  EXAM: CHEST - 2 VIEW  COMPARISON:  Chest x-ray dated Jan 16, 2019.  FINDINGS: The heart size and mediastinal contours are within normal limits. Normal pulmonary vascularity. Postsurgical changes in the left lung again noted. No focal consolidation, pleural effusion, or pneumothorax. No acute osseous abnormality.  IMPRESSION: Postsurgical changes in the left lung with resolved trace left apical pneumothorax. No active cardiopulmonary disease.   Electronically Signed   By: Xavier Howell M.D.   On: 02/04/2019 11:43  Impression and Plan: Overall, Xavier Howell has recovered well from thoracic surgery for recurrent, spontaneous left pneumothorax.  Regarding his pain, I have given him another prescription for Oxycodone (#12, no refill) as Tramadol has the potential to interact with Prozac and Trazadone. He asked for refills on Prozac, Trazadone, Olanzapine, and Vistaril. I gave him a refill for Vistaril but asked him to contact his medical doctor for refills on the other medications as soon as possible. He was instructed to avoid strenuous activity for the next 2 weeks. He will be seen by Dr. Roxan Howell in about 6 months with another CT scan. He is to contact the office in the interim if any problems, questions,  or concerns.  Nani Skillern, PA-C [facility] [phone]

## 2019-06-28 ENCOUNTER — Other Ambulatory Visit: Payer: Self-pay | Admitting: *Deleted

## 2019-06-28 DIAGNOSIS — Z8709 Personal history of other diseases of the respiratory system: Secondary | ICD-10-CM

## 2019-07-04 IMAGING — DX PORTABLE CHEST - 1 VIEW
1 series · 1 of 1 positions shown · non-contrast
Comparison: Radiograph January 14, 2019.

CLINICAL DATA: Pneumothorax.

EXAM:
PORTABLE CHEST 1 VIEW

[chest]
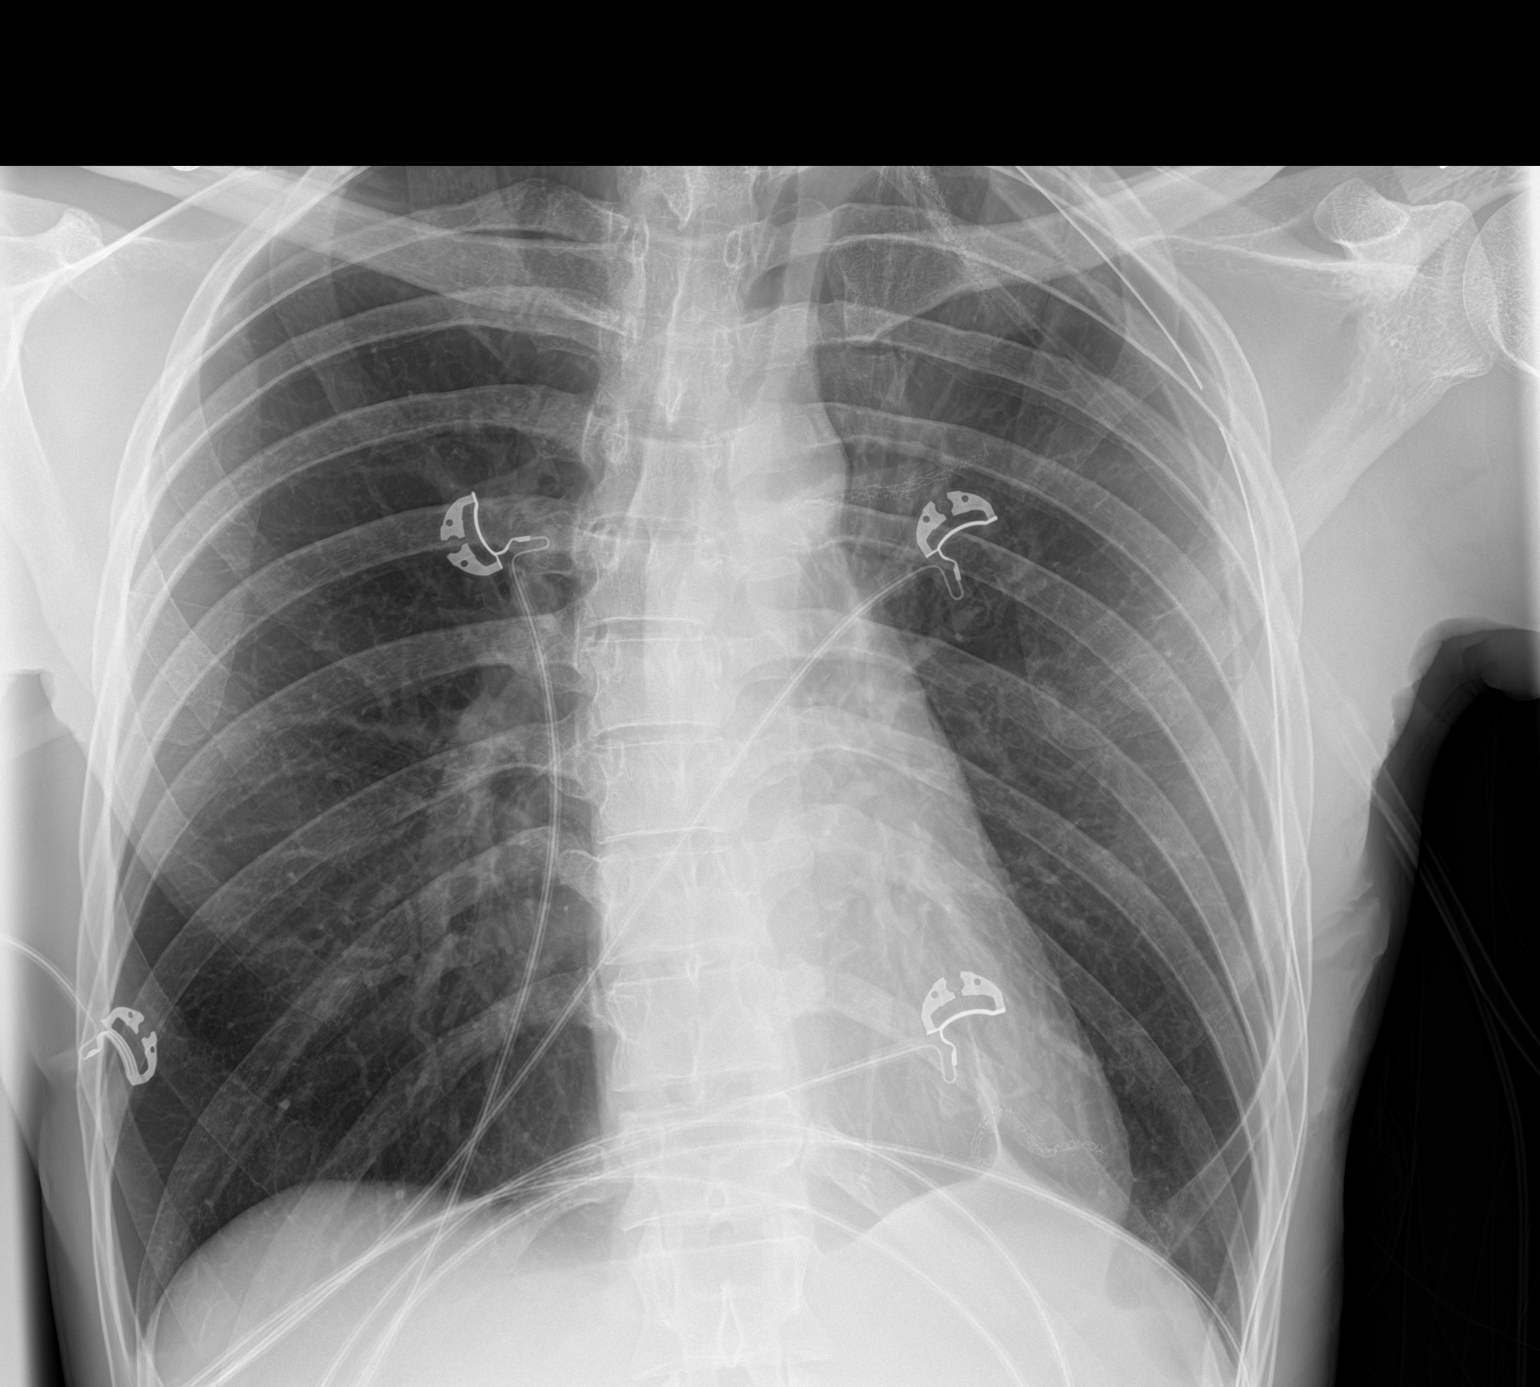

[1 of 1 positions shown; findings below may reference images not displayed]

FINDINGS: The heart size and mediastinal contours are within normal limits.
Stable position of left-sided chest tube. Postoperative changes
noted again in the left lung apex. Stable minimal left apical
pneumothorax is noted. Right lung is clear. The visualized skeletal
structures are unremarkable.
IMPRESSION: Stable position of left-sided chest tube with minimal left apical
pneumothorax.

## 2019-07-24 IMAGING — CR CHEST - 2 VIEW
2 series · 2 of 2 positions shown · non-contrast
Comparison: Chest x-ray dated January 16, 2019.

CLINICAL DATA: History of left-sided pneumothorax status post VATS
and bleb stapling.

EXAM:
CHEST - 2 VIEW

[w chest pa]
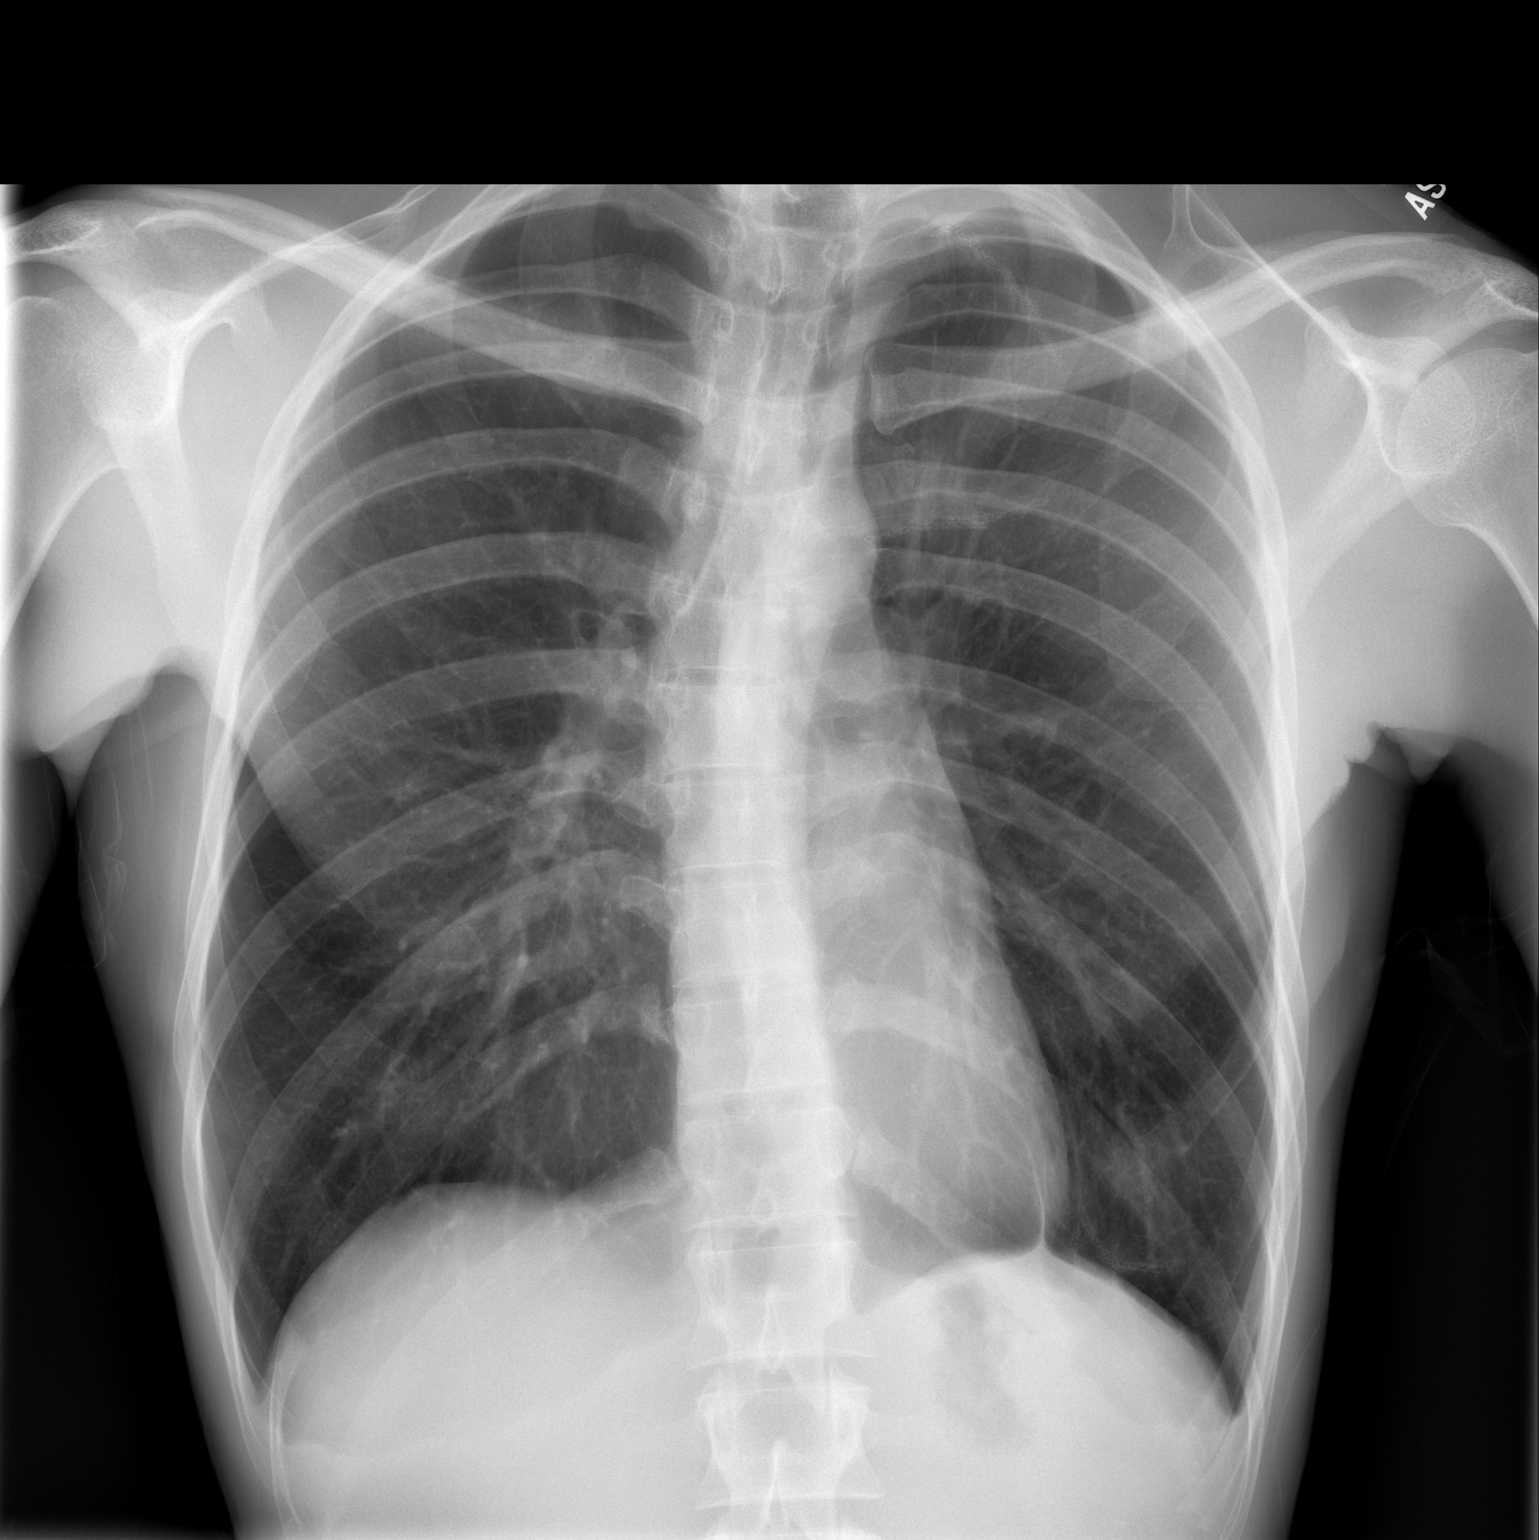

[w chest lat]
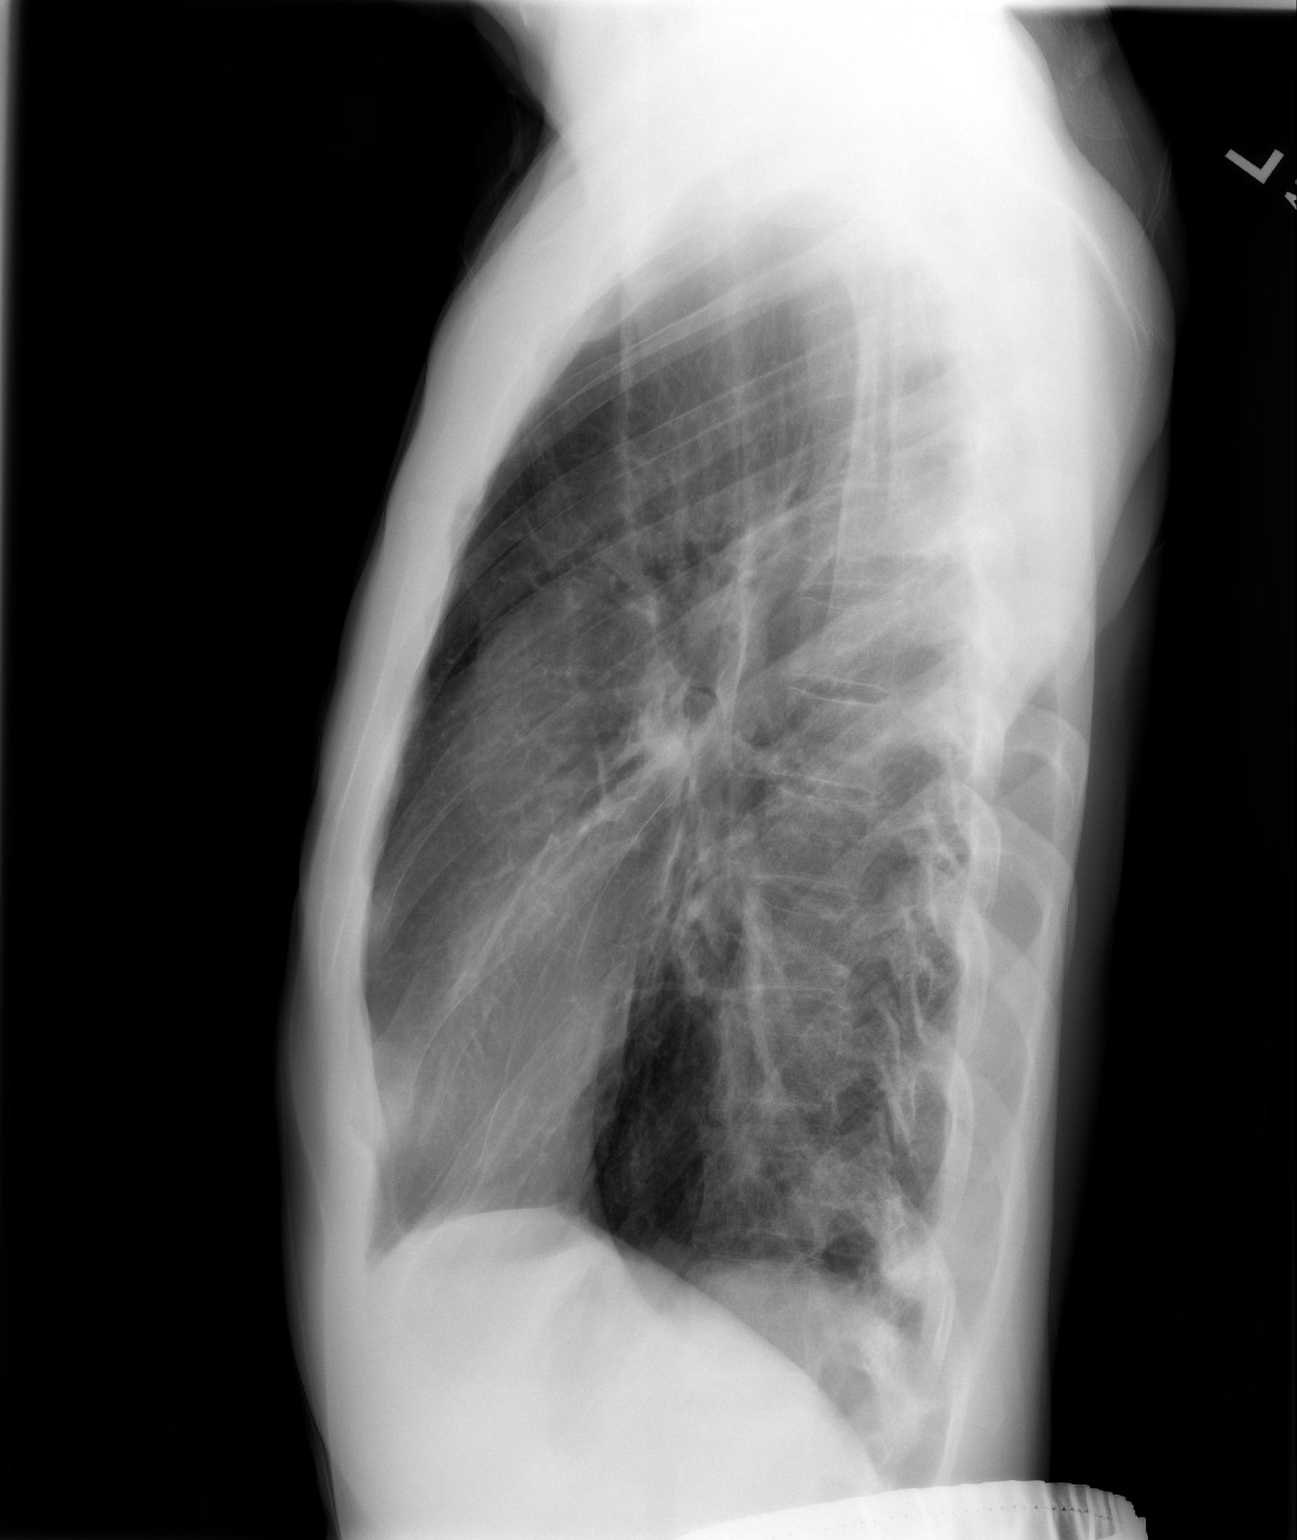

[2 of 2 positions shown; findings below may reference images not displayed]

FINDINGS: The heart size and mediastinal contours are within normal limits.
Normal pulmonary vascularity. Postsurgical changes in the left lung
again noted. No focal consolidation, pleural effusion, or
pneumothorax. No acute osseous abnormality.
IMPRESSION: Postsurgical changes in the left lung with resolved trace left
apical pneumothorax. No active cardiopulmonary disease.

## 2019-08-05 ENCOUNTER — Other Ambulatory Visit: Payer: Self-pay

## 2019-08-10 ENCOUNTER — Encounter: Payer: Self-pay | Admitting: Thoracic Surgery (Cardiothoracic Vascular Surgery)

## 2019-08-11 ENCOUNTER — Encounter: Payer: Self-pay | Admitting: Thoracic Surgery (Cardiothoracic Vascular Surgery)

## 2024-01-01 ENCOUNTER — Ambulatory Visit (HOSPITAL_COMMUNITY)
Admission: EM | Admit: 2024-01-01 | Discharge: 2024-01-02 | Disposition: A | Discharge status: Home / Self Care | Payer: Self-pay | Attending: Emergency Medicine | Admitting: Emergency Medicine

## 2024-01-01 ENCOUNTER — Emergency Department (HOSPITAL_COMMUNITY): Payer: Self-pay

## 2024-01-01 ENCOUNTER — Emergency Department (HOSPITAL_COMMUNITY): Payer: Self-pay | Admitting: Anesthesiology

## 2024-01-01 ENCOUNTER — Other Ambulatory Visit: Payer: Self-pay

## 2024-01-01 ENCOUNTER — Encounter (HOSPITAL_COMMUNITY): Payer: Self-pay

## 2024-01-01 ENCOUNTER — Encounter (HOSPITAL_COMMUNITY)
Admission: EM | Disposition: A | Discharge status: Home / Self Care | Payer: Self-pay | Source: Home / Self Care | Attending: Emergency Medicine

## 2024-01-01 ENCOUNTER — Emergency Department (HOSPITAL_BASED_OUTPATIENT_CLINIC_OR_DEPARTMENT_OTHER): Payer: Self-pay | Admitting: Anesthesiology

## 2024-01-01 DIAGNOSIS — N4400 Torsion of testis, unspecified: Secondary | ICD-10-CM

## 2024-01-01 DIAGNOSIS — F17210 Nicotine dependence, cigarettes, uncomplicated: Secondary | ICD-10-CM | POA: Insufficient documentation

## 2024-01-01 DIAGNOSIS — Q600 Renal agenesis, unilateral: Secondary | ICD-10-CM | POA: Insufficient documentation

## 2024-01-01 DIAGNOSIS — F418 Other specified anxiety disorders: Secondary | ICD-10-CM | POA: Insufficient documentation

## 2024-01-01 DIAGNOSIS — N4402 Intravaginal torsion of spermatic cord: Secondary | ICD-10-CM | POA: Insufficient documentation

## 2024-01-01 HISTORY — PX: TESTICLE TORSION REDUCTION: SHX795

## 2024-01-01 LAB — URINALYSIS, ROUTINE W REFLEX MICROSCOPIC
Bacteria, UA: NONE SEEN
Bilirubin Urine: NEGATIVE
Glucose, UA: NEGATIVE mg/dL
Hgb urine dipstick: NEGATIVE
Ketones, ur: 20 mg/dL — AB
Leukocytes,Ua: NEGATIVE
Nitrite: NEGATIVE
Protein, ur: 100 mg/dL — AB
Specific Gravity, Urine: 1.024 (ref 1.005–1.030)
pH: 5 (ref 5.0–8.0)

## 2024-01-01 SURGERY — TESTICULAR TORSION REPAIR
Anesthesia: General | Site: Scrotum | Laterality: Bilateral

## 2024-01-01 MED ORDER — BUPIVACAINE HCL (PF) 0.5 % IJ SOLN
INTRAMUSCULAR | Status: DC | PRN
  Administered 2024-01-01: 10 mL

## 2024-01-01 MED ORDER — OXYCODONE HCL 5 MG PO TABS
5.0000 mg | ORAL_TABLET | Freq: Once | ORAL | Status: AC | PRN
  Administered 2024-01-01: 5 mg via ORAL

## 2024-01-01 MED ORDER — ACETAMINOPHEN 10 MG/ML IV SOLN
INTRAVENOUS | Status: AC
  Filled 2024-01-01: qty 100

## 2024-01-01 MED ORDER — FENTANYL CITRATE (PF) 250 MCG/5ML IJ SOLN
INTRAMUSCULAR | Status: DC | PRN
  Administered 2024-01-01: 100 ug via INTRAVENOUS

## 2024-01-01 MED ORDER — AMISULPRIDE (ANTIEMETIC) 5 MG/2ML IV SOLN: 10.0000 mg | Freq: Once | INTRAVENOUS | Status: DC | PRN

## 2024-01-01 MED ORDER — KETOROLAC TROMETHAMINE 30 MG/ML IJ SOLN
INTRAMUSCULAR | Status: DC | PRN
  Administered 2024-01-01: 30 mg via INTRAVENOUS

## 2024-01-01 MED ORDER — MEPERIDINE HCL 50 MG/ML IJ SOLN: 6.2500 mg | INTRAMUSCULAR | Status: DC | PRN

## 2024-01-01 MED ORDER — CEFAZOLIN SODIUM-DEXTROSE 2-4 GM/100ML-% IV SOLN
2.0000 g | Freq: Once | INTRAVENOUS | Status: AC
  Administered 2024-01-01: 2 g via INTRAVENOUS

## 2024-01-01 MED ORDER — MORPHINE SULFATE (PF) 4 MG/ML IV SOLN
4.0000 mg | Freq: Once | INTRAVENOUS | Status: AC
  Administered 2024-01-01: 4 mg via INTRAVENOUS
  Filled 2024-01-01: qty 1

## 2024-01-01 MED ORDER — ONDANSETRON HCL 4 MG/2ML IJ SOLN
INTRAMUSCULAR | Status: DC | PRN
  Administered 2024-01-01: 4 mg via INTRAVENOUS

## 2024-01-01 MED ORDER — FENTANYL CITRATE (PF) 100 MCG/2ML IJ SOLN
INTRAMUSCULAR | Status: AC
  Filled 2024-01-01: qty 2

## 2024-01-01 MED ORDER — PROPOFOL 10 MG/ML IV BOLUS
INTRAVENOUS | Status: DC | PRN
  Administered 2024-01-01: 200 mg via INTRAVENOUS

## 2024-01-01 MED ORDER — EPHEDRINE SULFATE-NACL 50-0.9 MG/10ML-% IV SOSY
PREFILLED_SYRINGE | INTRAVENOUS | Status: DC | PRN
  Administered 2024-01-01: 5 mg via INTRAVENOUS

## 2024-01-01 MED ORDER — MIDAZOLAM HCL 2 MG/2ML IJ SOLN
INTRAMUSCULAR | Status: DC | PRN
  Administered 2024-01-01: 2 mg via INTRAVENOUS

## 2024-01-01 MED ORDER — PROPOFOL 10 MG/ML IV BOLUS
INTRAVENOUS | Status: AC
  Filled 2024-01-01: qty 20

## 2024-01-01 MED ORDER — MIDAZOLAM HCL 2 MG/2ML IJ SOLN: 0.5000 mg | Freq: Once | INTRAMUSCULAR | Status: DC | PRN

## 2024-01-01 MED ORDER — HYDROMORPHONE HCL 1 MG/ML IJ SOLN: 0.2500 mg | INTRAMUSCULAR | Status: DC | PRN

## 2024-01-01 MED ORDER — LIDOCAINE 2% (20 MG/ML) 5 ML SYRINGE
INTRAMUSCULAR | Status: DC | PRN
  Administered 2024-01-01: 40 mg via INTRAVENOUS

## 2024-01-01 MED ORDER — ACETAMINOPHEN 10 MG/ML IV SOLN
INTRAVENOUS | Status: DC | PRN
Start: 2024-01-01 — End: 2024-01-01
  Administered 2024-01-01: 1000 mg via INTRAVENOUS

## 2024-01-01 MED ORDER — OXYCODONE HCL 5 MG/5ML PO SOLN: 5.0000 mg | Freq: Once | ORAL | Status: AC | PRN

## 2024-01-01 MED ORDER — ONDANSETRON HCL 4 MG/2ML IJ SOLN
4.0000 mg | Freq: Once | INTRAMUSCULAR | Status: AC
  Administered 2024-01-01: 4 mg via INTRAVENOUS
  Filled 2024-01-01: qty 2

## 2024-01-01 MED ORDER — LACTATED RINGERS IV SOLN: INTRAVENOUS | Status: DC | PRN

## 2024-01-01 MED ORDER — DEXMEDETOMIDINE HCL IN NACL 80 MCG/20ML IV SOLN
INTRAVENOUS | Status: DC | PRN
  Administered 2024-01-01: 8 ug via INTRAVENOUS

## 2024-01-01 MED ORDER — DEXAMETHASONE SODIUM PHOSPHATE 10 MG/ML IJ SOLN
INTRAMUSCULAR | Status: AC
  Filled 2024-01-01: qty 1

## 2024-01-01 MED ORDER — ONDANSETRON HCL 4 MG/2ML IJ SOLN
INTRAMUSCULAR | Status: AC
  Filled 2024-01-01: qty 2

## 2024-01-01 MED ORDER — BUPIVACAINE HCL (PF) 0.5 % IJ SOLN
INTRAMUSCULAR | Status: AC
  Filled 2024-01-01: qty 30

## 2024-01-01 MED ORDER — CEFAZOLIN SODIUM-DEXTROSE 2-4 GM/100ML-% IV SOLN
INTRAVENOUS | Status: AC
  Filled 2024-01-01: qty 100

## 2024-01-01 MED ORDER — MIDAZOLAM HCL 2 MG/2ML IJ SOLN
INTRAMUSCULAR | Status: AC
  Filled 2024-01-01: qty 2

## 2024-01-01 MED ORDER — KETOROLAC TROMETHAMINE 30 MG/ML IJ SOLN
INTRAMUSCULAR | Status: AC
  Filled 2024-01-01: qty 1

## 2024-01-01 MED ORDER — DEXAMETHASONE SODIUM PHOSPHATE 10 MG/ML IJ SOLN
INTRAMUSCULAR | Status: DC | PRN
  Administered 2024-01-01: 10 mg via INTRAVENOUS

## 2024-01-01 MED ORDER — 0.9 % SODIUM CHLORIDE (POUR BTL) OPTIME
TOPICAL | Status: DC | PRN
  Administered 2024-01-01: 1000 mL

## 2024-01-01 SURGICAL SUPPLY — 27 items
BAG COUNTER SPONGE SURGICOUNT (BAG) IMPLANT
BNDG GAUZE DERMACEA FLUFF 4 (GAUZE/BANDAGES/DRESSINGS) ×1 IMPLANT
BRIEF MESH DISP LRG (UNDERPADS AND DIAPERS) IMPLANT
COVER SURGICAL LIGHT HANDLE (MISCELLANEOUS) ×1 IMPLANT
DRAPE LAPAROTOMY T 98X78 PEDS (DRAPES) ×1 IMPLANT
DRSG TELFA 3X8 NADH STRL (GAUZE/BANDAGES/DRESSINGS) IMPLANT
ELECT PENCIL ROCKER SW 15FT (MISCELLANEOUS) ×1 IMPLANT
ELECT REM PT RETURN 15FT ADLT (MISCELLANEOUS) ×1 IMPLANT
GLOVE SURG LX STRL 7.5 STRW (GLOVE) ×1 IMPLANT
GOWN STRL REUS W/ TWL XL LVL3 (GOWN DISPOSABLE) ×1 IMPLANT
KIT BASIN OR (CUSTOM PROCEDURE TRAY) ×1 IMPLANT
KIT TURNOVER KIT A (KITS) IMPLANT
NDL HYPO 22X1.5 SAFETY MO (MISCELLANEOUS) IMPLANT
NEEDLE HYPO 22X1.5 SAFETY MO (MISCELLANEOUS) ×1 IMPLANT
NS IRRIG 1000ML POUR BTL (IV SOLUTION) ×1 IMPLANT
PACK GENERAL/GYN (CUSTOM PROCEDURE TRAY) ×1 IMPLANT
PAD TELFA 2X3 NADH STRL (GAUZE/BANDAGES/DRESSINGS) IMPLANT
SUPPORTER AHLETIC TETRA LG (SOFTGOODS) ×1 IMPLANT
SUT CHROMIC 3 0 SH 27 (SUTURE) ×1 IMPLANT
SUT CHROMIC 4 0 RB 1X27 (SUTURE) IMPLANT
SUT PROLENE 3 0 SH1 36 (SUTURE) IMPLANT
SUT VIC AB 2-0 SH 27X BRD (SUTURE) IMPLANT
SUT VIC AB 2-0 UR5 27 (SUTURE) IMPLANT
SUT VICRYL 0 TIES 12 18 (SUTURE) IMPLANT
SYR CONTROL 10ML LL (SYRINGE) IMPLANT
TOWEL OR 17X26 10 PK STRL BLUE (TOWEL DISPOSABLE) ×1 IMPLANT
WATER STERILE IRR 1000ML POUR (IV SOLUTION) ×1 IMPLANT

## 2024-01-01 NOTE — Op Note (Signed)
 Preoperative diagnosis: Left testicular torsion Postoperative diagnosis: Same  Procedure: Scrotal exploration with bilateral orchiopexy  Surgeon: Derrick Fling  Anesthesia: General  Indication for procedure: Jeiden is a 38 year old male had the sudden onset of left testicular pain earlier today.  Scrotal ultrasound showed significant decrease in left blood flow consistent with torsion.  On exam the testicle was mildly swollen and high riding worrisome for torsion.  Findings: The penis was circumcised without mass or lesion.  The meatus and the glans appeared normal penis.  Normal without lesion.  Scrotum appeared normal.  The left spermatic cord was indeed twisted and the testicle was untwisted and noted to be purpleish and mottled in color.  It was palpably normal without mass.  The right testicle appeared normal and was palpably normal without mass.  Epididymis was palpably normal.  Once we fix the right testicle we went back and look at the left testicle and it had pinked up quite a bit and looked viable.  Doppler was used to confirm good fluid flow to the left testicle.    Description of procedure: After consent was obtained patient brought to the operating room.  After adequate anesthesia the external genitalia were prepped w and draped in the usual sterile fashion.  A 3 cm midline incision was made through the median raphae taken down through the dartos fascia and toward the left testicle where the tunica vaginalis was opened.  The left testicle was delivered and untwisted.  It appeared purple and mottled.  It was irrigated and wrapped in a moist lap and set to the side.  We then went through the dartos fascia toward the right testicle and opened the right tunica vaginalis and delivered the right testicle.  It appeared normal.  The testicle and the epididymis were palpably normal and it was placed back in the right hemiscrotum without torsion.  3-0 PDS was used to secure the testicle in 3 places  to the inside of the tunica vaginalis medially.  The tunica vaginalis was then run closed on the right side with a 3-0 PDS.  We then turned our attention back to the left side where the left testicle appeared to have a better color and much less purple in nature.  We used the Doppler to get a good signal through the tunica albuginea of the testicle confirming restoration of blood flow.  The testicle was irrigated and was placed back in the left hemiscrotum and then fixed in 3 places medially to the inside of the tunica vaginalis with 3-0 PDS.  The left tunica vaginalis was run closed with a 3-0 PDS.  The dartos fascia was closed with 3-0 PDS.  The skin was then reapproximated with 3-0 chromic horizontal mattress suture.  Telfa fluffs and a mesh underwear were placed.  He was awakened taken to cover room in stable condition.  Complications: None  Blood loss: Minimal  Specimens: None  Drains: None  Disposition: Patient stable to PACU.

## 2024-01-01 NOTE — H&P (Signed)
 H&P  Chief Complaint: Left testicular pain  History of Present Illness: Xavier Howell is a 38 year old male who developed the sudden onset of left testicular pain this morning.  [place].  He is continued to have pain with nausea.  Came to ER tonight and scrotal ultrasound revealed bilateral testicles had no mass but there was significantly diminished blood flow to the left testicle consistent with torsion.  Dr. Theresia Lo tried to manually detorsed the testicle but patient had dry heaves.  He denies any prior urologic surgery.  No painful urination or gross hematuria.  His UA was clear.  Past Medical History:  Diagnosis Date   Absent kidney    left kidney    Past Surgical History:  Procedure Laterality Date   CHEST TUBE INSERTION  01/11/2019   left chest    STAPLING OF BLEBS Left 01/13/2019   Procedure: STAPLING OF BLEBS;  Surgeon: Loreli Slot, MD;  Location: [facility] OR;  Service: Thoracic;  Laterality: Left;   VIDEO ASSISTED THORACOSCOPY Left 01/13/2019   Procedure: VIDEO ASSISTED THORACOSCOPY;  Surgeon: Loreli Slot, MD;  Location: [facility] OR;  Service: Thoracic;  Laterality: Left;    Home Medications:  Medications Prior to Admission  Medication Sig Dispense Refill Last Dose/Taking   acetaminophen (TYLENOL) 325 MG tablet Take 2 tablets (650 mg total) by mouth every 6 (six) hours as needed for moderate pain.      FLUoxetine (PROZAC) 20 MG capsule Take 1 capsule (20 mg total) by mouth daily. For depression (Patient not taking: Reported on 02/04/2019) 30 capsule 0    hydrOXYzine (ATARAX/VISTARIL) 25 MG tablet Take 1 tablet (25 mg total) by mouth 3 (three) times daily as needed for anxiety. 30 tablet 0    ibuprofen (ADVIL) 400 MG tablet Take 1 tablet (400 mg total) by mouth every 6 (six) hours as needed for moderate pain. 30 tablet 0    nicotine (NICODERM CQ - DOSED IN MG/24 HOURS) 21 mg/24hr patch Place 1 patch (21 mg total) onto the skin daily. For smoking cessation (Patient taking  differently: Place 21 mg onto the skin daily as needed (For smoking cessation). ) 28 patch 0    OLANZapine (ZYPREXA) 7.5 MG tablet Take 1 tablet (7.5 mg total) by mouth at bedtime. For mood control (Patient not taking: Reported on 02/04/2019) 30 tablet 0    oxyCODONE (OXY IR/ROXICODONE) 5 MG immediate release tablet Take 1 tablet (5 mg total) by mouth every 4 (four) hours as needed for severe pain. 12 tablet 0    traZODone (DESYREL) 50 MG tablet Take 1 tablet (50 mg) Q bedtime: For sleep (Patient not taking: Reported on 01/11/2019) 30 tablet 0    Allergies: No Known Allergies  Family History  Problem Relation Age of Onset   Alcoholism Mother    Cirrhosis Mother    Cancer Sister    Cirrhosis Brother    Lupus Other    Social History:  reports that he has been smoking cigarettes. He has never used smokeless tobacco. He reports that he does not drink alcohol and does not use drugs.  ROS: A complete review of systems was performed.  All systems are negative except for pertinent findings as noted. Review of Systems  All other systems reviewed and are negative.    Physical Exam:  Vital signs in last 24 hours: Temp:  [97.6 F (36.4 C)-98.3 F (36.8 C)] 98.3 F (36.8 C) (04/17 2130) Pulse Rate:  [44-50] 44 (04/17 2157) Resp:  [12-19]  12 (04/17 2130) BP: (140-161)/(75-98) 147/98 (04/17 2157) SpO2:  [97 %-100 %] 97 % (04/17 2130) Weight:  [61.2 kg] 61.2 kg (04/17 1839) General:  Alert and oriented, No acute distress HEENT: Normocephalic, atraumatic Neck: No JVD or lymphadenopathy Cardiovascular: Regular rate and rhythm Lungs: Regular rate and effort Abdomen: Soft, nontender, nondistended, no abdominal masses Back: No CVA tenderness Extremities: No edema Neurologic: Grossly intact GU: Penis circumcised without mass or lesion.  The glans and meatus appear normal.  The scrotum appears normal.  He will not let me touch it but the left testicle does look slightly swollen and high riding.   It does not look as swollen as an orchitis would.  Laboratory Data:  Results for orders placed or performed during the hospital encounter of 01/01/24 (from the past 24 hours)  Urinalysis, Routine w reflex microscopic -Urine, Clean Catch     Status: Abnormal   Collection Time: 01/01/24  7:59 PM  Result Value Ref Range   Color, Urine YELLOW YELLOW   APPearance CLEAR CLEAR   Specific Gravity, Urine 1.024 1.005 - 1.030   pH 5.0 5.0 - 8.0   Glucose, UA NEGATIVE NEGATIVE mg/dL   Hgb urine dipstick NEGATIVE NEGATIVE   Bilirubin Urine NEGATIVE NEGATIVE   Ketones, ur 20 (A) NEGATIVE mg/dL   Protein, ur 161 (A) NEGATIVE mg/dL   Nitrite NEGATIVE NEGATIVE   Leukocytes,Ua NEGATIVE NEGATIVE   RBC / HPF 0-5 0 - 5 RBC/hpf   WBC, UA 0-5 0 - 5 WBC/hpf   Bacteria, UA NONE SEEN NONE SEEN   Squamous Epithelial / HPF 0-5 0 - 5 /HPF   Mucus PRESENT    No results found for this or any previous visit (from the past 240 hours). Creatinine: No results for input(s): "CREATININE" in the last 168 hours.  Impression/Assessment:  Suspected left testicular torsion although discussed there could be other etiologies such as orchitis-  Plan:  Discussed with [name] I discussed with the patient the nature, potential benefits, risks and alternatives to scrotal exploration, bilateral orchiopexy, possible left orchiectomy, including side effects of the proposed treatment, the likelihood of the patient achieving the goals of the procedure, and any potential problems that might occur during the procedure or recuperation.  We discussed risk of orchiectomy such as low sperm counts or low testosterone among others.  He said he has had all the kids he wants.  That being said, we discussed typically patients can have normal testosterone or fertility with 1 testicle but it something we can monitor if the need arises.  All questions answered. Patient elects to proceed.  He is still in significant pain and has  nausea.   [name] 01/01/2024, 10:05 PM

## 2024-01-01 NOTE — Anesthesia Procedure Notes (Signed)
 Procedure Name: Intubation Date/Time: 01/01/2024 10:12 PM  Performed by: Stasia Edelman, CRNAPre-anesthesia Checklist: Patient identified, Emergency Drugs available, Suction available and Patient being monitored Patient Re-evaluated:Patient Re-evaluated prior to induction Oxygen Delivery Method: Circle System Utilized Preoxygenation: Pre-oxygenation with 100% oxygen Induction Type: IV induction, Rapid sequence and Cricoid Pressure applied Laryngoscope Size: Mac and 4 Grade View: Grade I Tube type: Oral Tube size: 7.5 mm Number of attempts: 1 Airway Equipment and Method: Stylet Placement Confirmation: ETT inserted through vocal cords under direct vision, positive ETCO2 and breath sounds checked- equal and bilateral Secured at: 23 cm Tube secured with: Tape Dental Injury: Teeth and Oropharynx as per pre-operative assessment

## 2024-01-01 NOTE — Transfer of Care (Signed)
 Immediate Anesthesia Transfer of Care Note  Patient: Xavier Howell  Procedure(s) Performed: TESTICULAR TORSION REPAIR (Bilateral: Scrotum)  Patient Location: PACU  Anesthesia Type:General  Level of Consciousness: drowsy and patient cooperative  Airway & Oxygen Therapy: Patient Spontanous Breathing  Post-op Assessment: Report given to RN and Post -op Vital signs reviewed and stable  Post vital signs: Reviewed and stable  Last Vitals:  Vitals Value Taken Time  BP 153/99 01/01/24 2312  Temp 36.4 C 01/01/24 2310  Pulse 88 01/01/24 2313  Resp 16 01/01/24 2313  SpO2 95 % 01/01/24 2313  Vitals shown include unfiled device data.  Last Pain:  Vitals:   01/01/24 2027  TempSrc:   PainSc: 7          Complications: No notable events documented.

## 2024-01-01 NOTE — Discharge Instructions (Signed)
 Bilateral orchiopexy (securing the testicles to the inside of the scrotum) Adult, Care After The following information offers guidance on how to care for yourself after your procedure. Your health care provider may also give you more specific instructions. If you have problems or questions, contact your health care provider. What can I expect after the procedure? After your procedure, it is common to have: Mild discomfort and swelling in the pouch that holds your testicles (scrotum). Bruising of the scrotum. Follow these instructions at home: Medicines Take over-the-counter and prescription medicines only as told by your health care provider. Ask your health care provider if the medicine prescribed to you: Requires you to avoid driving or using machinery. Can cause constipation. You may need to take these actions to prevent or treat constipation: Drink enough fluid to keep your urine pale yellow. Take over-the-counter or prescription medicines. Eat foods that are high in fiber, such as beans, whole grains, and fresh fruits and vegetables. Limit foods that are high in fat and processed sugars, such as fried or sweet foods. Bathing Do not take baths, swim, or use a hot tub until your health care provider approves.  You may shower on Saturday 01/03/2024. You may only be allowed to take sponge baths until then. If you were told to wear an athletic support strap (scrotal support), keep it dry. Take it off when you shower or bathe. Incision care  Follow instructions from your health care provider about how to take care of your incision. Make sure you: Wash your hands with soap and water for at least 20 seconds before and after you change your bandage (dressing). If soap and water are not available, use hand sanitizer. Change your dressing as told by your health care provider. Leave stitches (sutures), skin glue, or adhesive strips in place. These skin closures may need to stay in place for 2 weeks  or longer. If adhesive strip edges start to loosen and curl up, you may trim the loose edges. Do not remove adhesive strips completely unless your health care provider tells you to do that. Check your incision and scrotum every day for signs of infection. Check for: More redness, swelling, or pain. Fluid or blood. Warmth. Pus or a bad smell. Managing pain and swelling Put ice over the scrotum. To do this: Put ice in a plastic bag. Place a towel between your skin and the bag. Leave the ice on for 20 minutes, 2-3 times per day. Remove the ice if your skin turns bright red. This is very important. If you cannot feel pain, heat, or cold, you have a greater risk of damage to the area.   Activity Do not lift anything that is heavier than 10 lb (4.5 kg) until your health care provider says that it is safe. Usually 10-14 days.  If you were given a sedative during the procedure, it can affect you for several hours. Do not drive or operate machinery until your health care provider says that it is safe. Ask your health care provider when it is safe to drive. Return to your normal activities as told by your health care provider. Ask your health care provider what activities are safe for you. General instructions Do not use any products that contain nicotine or tobacco. These products include cigarettes, chewing tobacco, and vaping devices, such as e-cigarettes. These can delay healing after surgery. If you need help quitting, ask your health care provider. If you were given a scrotal support, wear it as told by  your health care provider. Keep all follow-up visits. This is important. If you had a drain put in during the procedure, you will need to have it removed at a follow-up visit. Contact a health care provider if: Your pain is not controlled with medicine. You have more redness, swelling, or pain around your scrotum. You have fluid or blood coming from your incision. Your incision feels warm to the  touch. You have pus or a bad smell coming from your incision. You have a fever. Get help right away if: You develop shaking, chills, and a fever that is higher than 101.53F (38.8C). You have redness or swelling that starts at your scrotum and spreads outward to your whole groin. You develop swelling in your legs. You have difficulty breathing. These symptoms may be an emergency. Get help right away. Call 911. Do not wait to see if the symptoms will go away. Do not drive yourself to the hospital. Summary After a hydrocelectomy, it is common to have mild discomfort, swelling, and bruising. Do not take baths, swim, or use a hot tub until your health care provider approves. Ask your health care provider if you may take showers. If directed, put ice on the affected area to help with pain and swelling. Do not lift anything that is heavier than 10 lb (4.5 kg) until your health care provider says that it is safe. Return to your normal activities as told by your health care provider. If you were given a scrotal support, keep it dry. Wear the scrotal support as told by your health care provider.

## 2024-01-01 NOTE — ED Triage Notes (Addendum)
 Patient has had left sided testicular swelling and pain since 11am. Unable to urinate since. Unable to sit still. Was sitting in the car "nuts started hurting" urinated and then pain and swelling began.

## 2024-01-01 NOTE — Anesthesia Preprocedure Evaluation (Signed)
 Anesthesia Evaluation  Patient identified by MRN, date of birth, ID band Patient awake    Reviewed: Allergy & Precautions, NPO status , Patient's Chart, lab work & pertinent test results  History of Anesthesia Complications Negative for: history of anesthetic complications  Airway Mallampati: I  TM Distance: >3 FB Neck ROM: Full    Dental  (+) Dental Advisory Given, Poor Dentition   Pulmonary Current Smoker H/o recurrent PTX: s/p VATS   breath sounds clear to auscultation       Cardiovascular negative cardio ROS  Rhythm:Regular Rate:Normal     Neuro/Psych   Anxiety Depression    negative neurological ROS     GI/Hepatic ,,,(+)     substance abuse  marijuana useN/V with this testicular torsion   Endo/Other  negative endocrine ROS    Renal/GU negative Renal ROSSolitary kidney     Musculoskeletal   Abdominal   Peds  Hematology negative hematology ROS (+)   Anesthesia Other Findings   Reproductive/Obstetrics                              Anesthesia Physical Anesthesia Plan  ASA: 2 and emergent  Anesthesia Plan: General   Post-op Pain Management: Ofirmev IV (intra-op)* and Toradol IV (intra-op)*   Induction: Intravenous and Rapid sequence  PONV Risk Score and Plan: 1 and Ondansetron and Dexamethasone  Airway Management Planned: Oral ETT  Additional Equipment: None  Intra-op Plan:   Post-operative Plan: Extubation in OR  Informed Consent: I have reviewed the patients History and Physical, chart, labs and discussed the procedure including the risks, benefits and alternatives for the proposed anesthesia with the patient or authorized representative who has indicated his/her understanding and acceptance.     Dental advisory given  Plan Discussed with: CRNA and Surgeon  Anesthesia Plan Comments:          Anesthesia Quick Evaluation

## 2024-01-01 NOTE — ED Provider Notes (Signed)
 [place] EMERGENCY DEPARTMENT AT [facility] Provider Note   CSN: [id number] Arrival date & time: 01/01/24  1809     History  Chief Complaint  Patient presents with   Testicle Pain    Xavier Howell is a 38 y.o. male.  Patient is a 38 year old male with a past medical history of congenital single kidney presenting to the emergency department with left-sided testicle pain.  Patient states around 11 AM this morning he had sudden onset of pain in his left testicle.  States it has been constant throughout the day and that his testicles felt swollen.  He denies any fevers.  Does endorse nausea and vomiting.  Denies any discharge.  He states that he is sexually active but denies any new sexual partners or concerns for STI.  Denies any dysuria or hematuria.  The history is provided by the patient.  Testicle Pain       Home Medications Prior to Admission medications   Medication Sig Start Date End Date Taking? Authorizing Provider  acetaminophen (TYLENOL) 325 MG tablet Take 2 tablets (650 mg total) by mouth every 6 (six) hours as needed for moderate pain. 02/18/17   Armandina Stammer I, NP  FLUoxetine (PROZAC) 20 MG capsule Take 1 capsule (20 mg total) by mouth daily. For depression Patient not taking: Reported on 02/04/2019 02/22/17   Armandina Stammer I, NP  hydrOXYzine (ATARAX/VISTARIL) 25 MG tablet Take 1 tablet (25 mg total) by mouth 3 (three) times daily as needed for anxiety. 02/04/19   Ardelle Balls, PA-C  ibuprofen (ADVIL) 400 MG tablet Take 1 tablet (400 mg total) by mouth every 6 (six) hours as needed for moderate pain. 01/16/19   Barrett, Erin R, PA-C  nicotine (NICODERM CQ - DOSED IN MG/24 HOURS) 21 mg/24hr patch Place 1 patch (21 mg total) onto the skin daily. For smoking cessation Patient taking differently: Place 21 mg onto the skin daily as needed (For smoking cessation).  02/21/17   Armandina Stammer I, NP  OLANZapine (ZYPREXA) 7.5 MG tablet Take 1 tablet (7.5 mg total)  by mouth at bedtime. For mood control Patient not taking: Reported on 02/04/2019 02/21/17   Armandina Stammer I, NP  oxyCODONE (OXY IR/ROXICODONE) 5 MG immediate release tablet Take 1 tablet (5 mg total) by mouth every 4 (four) hours as needed for severe pain. 02/04/19   Ardelle Balls, PA-C  traZODone (DESYREL) 50 MG tablet Take 1 tablet (50 mg) Q bedtime: For sleep Patient not taking: Reported on 01/11/2019 02/21/17   Sanjuana Kava, NP      Allergies    Patient has no known allergies.    Review of Systems   Review of Systems  Genitourinary:  Positive for testicular pain.    Physical Exam Updated Vital Signs BP (!) 140/75   Pulse (!) 50   Temp 97.6 F (36.4 C) (Oral)   Resp 19   Ht 6' (1.829 m)   Wt 61.2 kg   SpO2 100%   BMI 18.31 kg/m  Physical Exam Vitals and nursing note reviewed. Exam conducted with a chaperone present Dietitian).  Constitutional:      General: He is not in acute distress.    Appearance: Normal appearance.     Comments: Uncomfortable appearing  HENT:     Head: Normocephalic and atraumatic.     Nose: Nose normal.     Mouth/Throat:     Mouth: Mucous membranes are moist.     Pharynx: Oropharynx is  clear.  Eyes:     Extraocular Movements: Extraocular movements intact.  Cardiovascular:     Rate and Rhythm: Normal rate and regular rhythm.     Heart sounds: Normal heart sounds.  Pulmonary:     Effort: Pulmonary effort is normal.     Breath sounds: Normal breath sounds.  Abdominal:     General: Abdomen is flat.     Palpations: Abdomen is soft.     Tenderness: There is no abdominal tenderness.  Genitourinary:    Comments: L testicle high riding, tender, firm, and mildly swollen, no erythema or warmth of the scrotum No penile discharge or lesions Musculoskeletal:        General: Normal range of motion.     Cervical back: Normal range of motion.  Skin:    General: Skin is warm and dry.  Neurological:     General: No focal deficit  present.     Mental Status: He is alert and oriented to person, place, and time.  Psychiatric:        Mood and Affect: Mood normal.        Behavior: Behavior normal.     ED Results / Procedures / Treatments   Labs (all labs ordered are listed, but only abnormal results are displayed) Labs Reviewed  URINALYSIS, ROUTINE W REFLEX MICROSCOPIC - Abnormal; Notable for the following components:      Result Value   Ketones, ur 20 (*)    Protein, ur 100 (*)    All other components within normal limits    EKG None  Radiology US SCROTUM W/DOPPLER Result Date: 01/01/2024 CLINICAL DATA:  Left-sided testicular pain for several hours, initial encounter EXAM: SCROTAL ULTRASOUND DOPPLER ULTRASOUND OF THE TESTICLES TECHNIQUE: Complete ultrasound examination of the testicles, epididymis, and other scrotal structures was performed. Color and spectral Doppler ultrasound were also utilized to evaluate blood flow to the testicles. COMPARISON:  None Available. FINDINGS: Right testicle Measurements: 5.1 x 2.9 x 3.1 cm. No mass or microlithiasis visualized. Left testicle Measurements: 5.0 x 2.8 x 2.5 cm. No mass or microlithiasis visualized. Right epididymis:  Not well visualized Left epididymis:  Not well visualized Hydrocele:  Left-sided hydrocele is noted. Varicocele:  None visualized. Pulsed Doppler interrogation of both testes demonstrates normal low resistance arterial and venous waveforms within the right testicle. Significant decrease in vascularity is noted on the left suspicious for torsion. IMPRESSION: Findings suspicion for testicular torsion. Left hydrocele. Critical Value/emergent results were called by telephone at the time of interpretation on 01/01/2024 at 8:29 pm to [facility], [facility] , who verbally acknowledged these results. Electronically Signed   By: Alcide Clever M.D.   On: 01/01/2024 20:32    Procedures .Critical Care  Performed by: Rexford Maus, DO Authorized by: Rexford Maus, DO   Critical care provider statement:    Critical care time (minutes):  30   Critical care was time spent personally by me on the following activities:  Development of treatment plan with patient or surrogate, discussions with consultants, evaluation of patient's response to treatment, examination of patient, ordering and review of laboratory studies, ordering and review of radiographic studies, ordering and performing treatments and interventions, pulse oximetry, re-evaluation of patient's condition and review of old charts     Medications Ordered in ED Medications  morphine (PF) 4 MG/ML injection 4 mg (4 mg Intravenous Given 01/01/24 1957)  ondansetron (ZOFRAN) injection 4 mg (4 mg Intravenous Given 01/01/24 1956)  ondansetron (ZOFRAN) injection 4 mg (4  mg Intravenous Given 01/01/24 2057)    ED Course/ Medical Decision Making/ A&P Clinical Course as of 01/01/24 2115  Thu Jan 01, 2024  2037 Received call from radiology that US  is concerning for torsion. Urology consulted. [VK]  2045 I spoke with Dr. Derrick Fling of urology who will call OR, but will likely need transfer to [facility] for OR due to lack of anesthesia coverage. Will call back for recommendations. I did attempt manual detorsion at bedside, however patient wasn't able to tolerate 2/2 to pain and nausea. [VK]  2110 I received callback from Dr. Derrick Fling and will have OR availability here and will be admitted to OR here. [VK]    Clinical Course User Index [VK] Kingsley, Jacody Beneke K, DO                                 Medical Decision Making This patient presents to the ED with chief complaint(s) of testicular pain with pertinent past medical history of congenital single kidney which further complicates the presenting complaint. The complaint involves an extensive differential diagnosis and also carries with it a high risk of complications and morbidity.    The differential diagnosis includes torsion, epididymitis, orchitis, UTI,  STI  Additional history obtained: Additional history obtained from spouse Records reviewed N/A  ED Course and Reassessment: On patient's arrival he is hemodynamically stable in no acute distress.  Was initially evaluated by provider in triage and had urinalysis and testicular ultrasound performed and was shortly brought back to the room.  Patient ultrasound was completed on my evaluation, did have a high riding firm and tender testicle concerning for torsion.  I did call radiology to expedite his read.  He is given pain and nausea control and will be closely reassessed.  Independent labs interpretation:  The following labs were independently interpreted: UA within normal range  Independent visualization of imaging: - I independently visualized the following imaging with scope of interpretation limited to determining acute life threatening conditions related to emergency care: Testicular ultrasound, which revealed concerning for torsion of the left testicle  Consultation: - Consulted or discussed management/test interpretation w/ external professional: Urology  Consideration for admission or further workup: he requires admission to the OR for his torsion Social Determinants of health: N/A    Risk Prescription drug management. Decision regarding hospitalization.          Final Clinical Impression(s) / ED Diagnoses Final diagnoses:  Testicular torsion    Rx / DC Orders ED Discharge Orders     None         Kingsley, Brooks Kinnan K, DO 01/01/24 2115

## 2024-01-01 NOTE — Anesthesia Postprocedure Evaluation (Signed)
 Anesthesia Post Note  Patient: Xavier Howell  Procedure(s) Performed: TESTICULAR TORSION REPAIR (Bilateral: Scrotum)     Patient location during evaluation: PACU Anesthesia Type: General Level of consciousness: awake and alert, patient cooperative and oriented Pain management: pain level controlled Vital Signs Assessment: post-procedure vital signs reviewed and stable Respiratory status: spontaneous breathing, nonlabored ventilation and respiratory function stable Cardiovascular status: blood pressure returned to baseline and stable Postop Assessment: no apparent nausea or vomiting Anesthetic complications: no   No notable events documented.  Last Vitals:  Vitals:   01/01/24 2310 01/01/24 2315  BP: (!) 153/99 (!) 158/97  Pulse: 87 85  Resp: 17 19  Temp: 36.4 C   SpO2: 95% 97%    Last Pain:  Vitals:   01/01/24 2027  TempSrc:   PainSc: 7                  Hana Trippett,E. Keyleen Cerrato

## 2024-01-02 ENCOUNTER — Encounter (HOSPITAL_COMMUNITY): Payer: Self-pay | Admitting: Urology

## 2024-02-04 ENCOUNTER — Encounter (HOSPITAL_COMMUNITY): Payer: Self-pay | Admitting: Urology
